# Patient Record
Sex: Female | Born: 1975 | Race: Black or African American | Hispanic: No | Marital: Single | Smoking: Former smoker
Health system: Southern US, Community
[De-identification: ages and names within clinical notes are randomized; demographics above are authoritative.]

## PROBLEM LIST (undated history)

## (undated) DIAGNOSIS — D649 Anemia, unspecified: Secondary | ICD-10-CM

## (undated) HISTORY — PX: WISDOM TOOTH EXTRACTION: SHX21

---

## 2014-07-30 ENCOUNTER — Emergency Department (HOSPITAL_COMMUNITY)
Admission: EM | Admit: 2014-07-30 | Discharge: 2014-07-30 | Disposition: A | Discharge status: Home / Self Care | Payer: Self-pay | Attending: Emergency Medicine | Admitting: Emergency Medicine

## 2014-07-30 ENCOUNTER — Emergency Department (HOSPITAL_COMMUNITY): Payer: Self-pay

## 2014-07-30 ENCOUNTER — Encounter (HOSPITAL_COMMUNITY): Payer: Self-pay | Admitting: Emergency Medicine

## 2014-07-30 DIAGNOSIS — R1011 Right upper quadrant pain: Secondary | ICD-10-CM | POA: Insufficient documentation

## 2014-07-30 DIAGNOSIS — Z862 Personal history of diseases of the blood and blood-forming organs and certain disorders involving the immune mechanism: Secondary | ICD-10-CM | POA: Insufficient documentation

## 2014-07-30 DIAGNOSIS — Z720 Tobacco use: Secondary | ICD-10-CM | POA: Insufficient documentation

## 2014-07-30 DIAGNOSIS — R079 Chest pain, unspecified: Secondary | ICD-10-CM | POA: Insufficient documentation

## 2014-07-30 DIAGNOSIS — R1013 Epigastric pain: Secondary | ICD-10-CM | POA: Insufficient documentation

## 2014-07-30 DIAGNOSIS — F419 Anxiety disorder, unspecified: Secondary | ICD-10-CM | POA: Insufficient documentation

## 2014-07-30 LAB — HEPATIC FUNCTION PANEL
ALK PHOS: 59 U/L (ref 39–117)
ALT: 8 U/L (ref 0–35)
AST: 19 U/L (ref 0–37)
Albumin: 3.9 g/dL (ref 3.5–5.2)
Bilirubin, Direct: <0.1 mg/dL (ref 0.0–0.5)
Total Bilirubin: 0.4 mg/dL (ref 0.3–1.2)
Total Protein: 7.8 g/dL (ref 6.0–8.3)

## 2014-07-30 LAB — BASIC METABOLIC PANEL
Anion gap: 7 (ref 5–15)
BUN: 12 mg/dL (ref 6–23)
CO2: 26 mmol/L (ref 19–32)
CREATININE: 0.76 mg/dL (ref 0.50–1.10)
Calcium: 8.8 mg/dL (ref 8.4–10.5)
Chloride: 107 mmol/L (ref 96–112)
GFR calc Af Amer: >90 mL/min (ref >90)
GFR calc non Af Amer: >90 mL/min (ref >90)
Glucose, Bld: 101 mg/dL — ABNORMAL HIGH (ref 70–99)
POTASSIUM: 3.4 mmol/L — AB (ref 3.5–5.1)
Sodium: 140 mmol/L (ref 135–145)

## 2014-07-30 LAB — CBC
HCT: 31 % — ABNORMAL LOW (ref 36.0–46.0)
Hemoglobin: 9.2 g/dL — ABNORMAL LOW (ref 12.0–15.0)
MCH: 18.2 pg — AB (ref 26.0–34.0)
MCHC: 29.7 g/dL — ABNORMAL LOW (ref 30.0–36.0)
MCV: 61.3 fL — ABNORMAL LOW (ref 78.0–100.0)
Platelets: 223 10*3/uL (ref 150–400)
RBC: 5.06 MIL/uL (ref 3.87–5.11)
RDW: 19.2 % — ABNORMAL HIGH (ref 11.5–15.5)
WBC: 8 10*3/uL (ref 4.0–10.5)

## 2014-07-30 LAB — I-STAT TROPONIN, ED: Troponin i, poc: 0 ng/mL (ref 0.00–0.08)

## 2014-07-30 LAB — BRAIN NATRIURETIC PEPTIDE: B NATRIURETIC PEPTIDE 5: 64.1 pg/mL (ref 0.0–100.0)

## 2014-07-30 LAB — LIPASE, BLOOD: LIPASE: 24 U/L (ref 11–59)

## 2014-07-30 MED ORDER — OXYCODONE-ACETAMINOPHEN 5-325 MG PO TABS: 1.0000 | ORAL_TABLET | Freq: Four times a day (QID) | ORAL | Status: DC | PRN

## 2014-07-30 MED ORDER — AZITHROMYCIN 250 MG PO TABS: 1000.0000 mg | ORAL_TABLET | Freq: Once | ORAL | Status: DC

## 2014-07-30 MED ORDER — AZITHROMYCIN 250 MG PO TABS: 250.0000 mg | ORAL_TABLET | Freq: Every day | ORAL | Status: DC

## 2014-07-30 MED ORDER — AZITHROMYCIN 250 MG PO TABS
500.0000 mg | ORAL_TABLET | Freq: Once | ORAL | Status: AC
  Administered 2014-07-30: 500 mg via ORAL
  Filled 2014-07-30: qty 2

## 2014-07-30 MED ORDER — CIPROFLOXACIN HCL 500 MG PO TABS: 500.0000 mg | ORAL_TABLET | Freq: Two times a day (BID) | ORAL | Status: DC

## 2014-07-30 MED ORDER — OXYCODONE-ACETAMINOPHEN 5-325 MG PO TABS
1.0000 | ORAL_TABLET | Freq: Once | ORAL | Status: AC
  Administered 2014-07-30: 1 via ORAL
  Filled 2014-07-30: qty 1

## 2014-07-30 MED ORDER — KETOROLAC TROMETHAMINE 60 MG/2ML IM SOLN
60.0000 mg | Freq: Once | INTRAMUSCULAR | Status: AC
  Administered 2014-07-30: 60 mg via INTRAMUSCULAR
  Filled 2014-07-30: qty 2

## 2014-07-30 MED ORDER — NAPROXEN 500 MG PO TABS: 500.0000 mg | ORAL_TABLET | Freq: Two times a day (BID) | ORAL | Status: DC

## 2014-07-30 NOTE — ED Notes (Addendum)
MD at bedside. EDPA PRESENT TO EVALUATE THIS PT

## 2014-07-30 NOTE — ED Provider Notes (Signed)
CSN: [id number]     Arrival date & time 07/30/14  1256 History   First MD Initiated Contact with Patient 07/30/14 1503     Chief Complaint  Patient presents with  . Chest Pain     (Consider location/radiation/quality/duration/timing/severity/associated sxs/prior Treatment) HPI Comments: Patient with history of iron-deficiency anemia -- presents with complaint of chest pressure which began at rest at approximately 7 PM last night. Patient initially felt the pressure in the middle of her chest and states that he later moved to the right side of her chest. It did not radiate. It was not associated with significant shortness of breath, palpitations, diaphoresis. Patient states that she felt nauseous. She ate a sub after the pain began and vomited afterwards. She has not had any further vomiting. Patient states that the pain is worse with breathing in and out, palpation of her chest, movement and certain positions. No cough or fever. Patient denies risk factors for pulmonary embolism including: unilateral leg swelling, history of DVT/PE/other blood clots, use of estrogens, recent immobilizations, recent surgery, recent travel (>4hr segment), malignancy, hemoptysis. Patient states that she had an episode of pain several weeks ago that was very similar to current. Patient denies drug and alcohol use including cocaine. Pain has gradually resolved over the past half an hour and she currently only has pain when sitting up or with someone pushing on her chest wall. Cardiac risk factors: smoking. No DM, HTN, high cholesterol.     Patient is a 39 y.o. female presenting with chest pain. The history is provided by the patient.  Chest Pain Associated symptoms: no abdominal pain, no back pain, no cough, no diaphoresis, no fever, no nausea, no palpitations, no shortness of breath and not vomiting     History reviewed. No pertinent past medical history. History reviewed. No pertinent past surgical history. No  family history on file. History  Substance Use Topics  . Smoking status: Current Every Day Smoker    Types: Cigarettes  . Smokeless tobacco: Not on file  . Alcohol Use: No   OB History    No data available     Review of Systems  Constitutional: Negative for fever and diaphoresis.  Eyes: Negative for redness.  Respiratory: Negative for cough and shortness of breath.   Cardiovascular: Positive for chest pain. Negative for palpitations and leg swelling.  Gastrointestinal: Negative for nausea, vomiting and abdominal pain.  Genitourinary: Negative for dysuria.  Musculoskeletal: Negative for back pain and neck pain.  Skin: Negative for rash.  Neurological: Negative for syncope and light-headedness.      Allergies  Review of patient's allergies indicates no known allergies.  Home Medications   Prior to Admission medications   Medication Sig Start Date End Date Taking? Authorizing Provider  aspirin EC 325 MG tablet Take 325 mg by mouth every 4 (four) hours as needed for mild pain.   Yes Historical Provider, MD  loratadine (CLARITIN) 10 MG tablet Take 10 mg by mouth daily as needed for allergies.   Yes Historical Provider, MD  pseudoephedrine (SUDAFED) 120 MG 12 hr tablet Take 120 mg by mouth 2 (two) times daily as needed for congestion.   Yes Historical Provider, MD   BP 103/70 mmHg  Pulse 84  Temp(Src) 98 F (36.7 C) (Oral)  Resp 20  SpO2 98%  LMP 07/28/2014 Physical Exam  Constitutional: She appears well-developed and well-nourished.  HENT:  Head: Normocephalic and atraumatic.  Right Ear: External ear normal.  Left Ear: External ear normal.  Nose: Nose normal.  Mouth/Throat: Oropharynx is clear and moist. No oropharyngeal exudate.  Eyes: Conjunctivae are normal. Pupils are equal, round, and reactive to light. Right eye exhibits no discharge. Left eye exhibits no discharge.  Neck: Normal range of motion. Neck supple.  Cardiovascular: Normal rate, regular rhythm and  normal heart sounds.   No murmur heard. Pulmonary/Chest: Effort normal and breath sounds normal. No respiratory distress. She has no wheezes. She has no rales. She exhibits tenderness.  Abdominal: Soft. Bowel sounds are normal. There is no hepatosplenomegaly. There is tenderness (moderate) in the right upper quadrant and epigastric area. There is no rebound, no guarding and no CVA tenderness.  Neurological: She is alert.  Skin: Skin is warm and dry.  Psychiatric: Her mood appears anxious.  Nursing note and vitals reviewed.   ED Course  Procedures (including critical care time) Labs Review Labs Reviewed  BASIC METABOLIC PANEL - Abnormal; Notable for the following:    Potassium 3.4 (*)    Glucose, Bld 101 (*)    All other components within normal limits  CBC - Abnormal; Notable for the following:    Hemoglobin 9.2 (*)    HCT 31.0 (*)    MCV 61.3 (*)    MCH 18.2 (*)    MCHC 29.7 (*)    RDW 19.2 (*)    All other components within normal limits  BRAIN NATRIURETIC PEPTIDE  HEPATIC FUNCTION PANEL  LIPASE, BLOOD  I-STAT TROPOININ, ED    Imaging Review Dg Chest 2 View  07/30/2014   CLINICAL DATA:  One day history of chest pain and difficulty breathing  EXAM: CHEST  2 VIEW  COMPARISON:  None.  FINDINGS: There is patchy bibasilar atelectatic change, more on the right than on the left. Lungs elsewhere clear. Heart size and pulmonary vascularity are normal. No adenopathy. No pneumothorax. No bone lesions.  IMPRESSION: Patchy bibasilar atelectasis. Suspect early pneumonia in these areas. Elsewhere lungs clear.   Electronically Signed   By: Lowella Grip M.D.   On: 07/30/2014 16:20     EKG Interpretation None       3:56 PM Patient seen and examined. Work-up initiated. Medications ordered. EKG reviewed. Repeat performed and is unchanged. Hepatic fcn panel and lipase ordered due to upper abd pain.    Date: 07/30/2014  Rate: 86  Rhythm: normal sinus rhythm  QRS Axis: normal   Intervals: normal  ST/T Wave abnormalities: nonspecific ST/T changes  Conduction Disutrbances:none  Narrative Interpretation:   Old EKG Reviewed: none available   Vital signs reviewed and are as follows: BP 103/70 mmHg  Pulse 84  Temp(Src) 98 F (36.7 C) (Oral)  Resp 20  SpO2 98%  LMP 07/28/2014  5:58 PM Checked patient, informed of results. She is now having more right-sided CP and appears uncomfortable. Vitals are stable.   PO Percocet and IM toradol pending.   7:53 PM Patient was much more comfortable after medications. Will discharge to home with pain medication, NSAIDs, azithromycin for possible pneumonia.  Patient was counseled to return with severe chest pain, especially if the pain is crushing or pressure-like and spreads to the arms, back, neck, or jaw, or if they have sweating, nausea, or shortness of breath with the pain. They were encouraged to call 911 with these symptoms.   They were also told to return if their chest pain gets worse and does not go away with rest, they have an attack of chest pain lasting longer than usual despite rest and treatment  with the medications their caregiver has prescribed, if they wake from sleep with chest pain or shortness of breath, if they feel dizzy or faint, if they have chest pain not typical of their usual pain, or if they have any other emergent concerns regarding their health.  The patient verbalized understanding and agreed.       MDM   Final diagnoses:  Chest pain, unspecified chest pain type   Patient with right-sided chest pain and upper abdominal pain on exam. Pain is been ongoing since last night. Patient has had negative troponin, nonischemic EKG with early repolarization changes, poor story for ACS. Symptoms are not exertional. Patient has relatively few risk factors for ACS. Patient is PERC negative. Given upper abdominal pain, hepatic function panel and lipase or ordered and were normal. Symptoms are currently  controlled. I do not suspect acute cholecystitis. No absolute indication for abdominal ultrasound at this time. Patient appears well at time of discharge. Her vital signs are stable. Appropriate return instructions discussed with patient at bedside.    Carlisle Cater, PA-C 07/30/14 1958  Leota Jacobsen, MD 08/03/14 [phone]

## 2014-07-30 NOTE — Discharge Instructions (Signed)
Please read and follow all provided instructions.  Your diagnoses today include:  1. Chest pain, unspecified chest pain type    Tests performed today include:  An EKG of your heart  A chest x-ray - shows possible pneumonia  Cardiac enzymes - a blood test for heart muscle damage  Blood counts and electrolytes  Liver and pancreas test - normal  Vital signs. See below for your results today.   Medications prescribed:   Azithromycin - antibiotic for respiratory infection  You have been prescribed an antibiotic medicine: take the entire course of medicine even if you are feeling better. Stopping early can cause the antibiotic not to work.   Percocet (oxycodone/acetaminophen) - narcotic pain medication  DO NOT drive or perform any activities that require you to be awake and alert because this medicine can make you drowsy. BE VERY CAREFUL not to take multiple medicines containing Tylenol (also called acetaminophen). Doing so can lead to an overdose which can damage your liver and cause liver failure and possibly death.   Naproxen - anti-inflammatory pain medication  Do not exceed 500mg  naproxen every 12 hours, take with food  You have been prescribed an anti-inflammatory medication or NSAID. Take with food. Take smallest effective dose for the shortest duration needed for your pain. Stop taking if you experience stomach pain or vomiting.   Take any prescribed medications only as directed.  Follow-up instructions: Please follow-up with your primary care provider as soon as you can for further evaluation of your symptoms.   Return instructions:  SEEK IMMEDIATE MEDICAL ATTENTION IF:  You have severe chest pain, especially if the pain is crushing or pressure-like and spreads to the arms, back, neck, or jaw, or if you have sweating, nausea (feeling sick to your stomach), or shortness of breath. THIS IS AN EMERGENCY. Don't wait to see if the pain will go away. Get medical help at  once. Call 911 or 0 (operator). DO NOT drive yourself to the hospital.   Your chest pain gets worse and does not go away with rest.   You have an attack of chest pain lasting longer than usual, despite rest and treatment with the medications your caregiver has prescribed.   You wake from sleep with chest pain or shortness of breath.  You feel dizzy or faint.  You have chest pain not typical of your usual pain for which you originally saw your caregiver.   You have any other emergent concerns regarding your health.  Additional Information: Chest pain comes from many different causes. Your caregiver has diagnosed you as having chest pain that is not specific for one problem, but does not require admission.  You are at low risk for an acute heart condition or other serious illness.   Your vital signs today were: BP 133/100 mmHg   Pulse 84   Temp(Src) 98 F (36.7 C) (Oral)   Resp 16   Ht 5\' 5"  (1.651 m)   Wt 115 lb (52.164 kg)   BMI 19.14 kg/m2   SpO2 93%   LMP 07/28/2014 If your blood pressure (BP) was elevated above 135/85 this visit, please have this repeated by your doctor within one month. --------------

## 2014-07-30 NOTE — ED Notes (Signed)
MD at bedside. EDPA JOSH

## 2014-07-30 NOTE — ED Notes (Signed)
MD at bedside. EDPA PRESENT RE EVALUATING THIS PT

## 2014-07-30 NOTE — ED Notes (Signed)
MD at bedside. [place]

## 2014-07-30 NOTE — ED Notes (Signed)
Pt c/o mid chest pain that started last night that "feels like an elephant on her chest". Pt states shob and vomiting last night.  Pt restless in wheelchair.

## 2014-07-30 NOTE — ED Notes (Signed)
Patient transported to X-ray 

## 2016-03-15 ENCOUNTER — Encounter (HOSPITAL_COMMUNITY): Payer: Self-pay | Admitting: Emergency Medicine

## 2016-03-15 ENCOUNTER — Ambulatory Visit (HOSPITAL_COMMUNITY)
Admission: EM | Admit: 2016-03-15 | Discharge: 2016-03-15 | Disposition: A | Discharge status: Home / Self Care | Payer: Self-pay | Attending: Family Medicine | Admitting: Family Medicine

## 2016-03-15 DIAGNOSIS — M545 Low back pain: Secondary | ICD-10-CM

## 2016-03-15 DIAGNOSIS — M542 Cervicalgia: Secondary | ICD-10-CM

## 2016-03-15 MED ORDER — CYCLOBENZAPRINE HCL 5 MG PO TABS: 5.0000 mg | ORAL_TABLET | Freq: Three times a day (TID) | ORAL | 0 refills | Status: DC

## 2016-03-15 MED ORDER — IBUPROFEN 800 MG PO TABS: 800.0000 mg | ORAL_TABLET | Freq: Three times a day (TID) | ORAL | 1 refills | Status: DC
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
## 2019-07-16 ENCOUNTER — Ambulatory Visit
Admission: EM | Admit: 2019-07-16 | Discharge: 2019-07-16 | Disposition: A | Discharge status: Home / Self Care | Payer: BLUE CROSS/BLUE SHIELD | Attending: Emergency Medicine | Admitting: Emergency Medicine

## 2019-07-16 DIAGNOSIS — L84 Corns and callosities: Secondary | ICD-10-CM

## 2019-07-16 DIAGNOSIS — L97521 Non-pressure chronic ulcer of other part of left foot limited to breakdown of skin: Secondary | ICD-10-CM | POA: Diagnosis not present

## 2019-07-16 DIAGNOSIS — N852 Hypertrophy of uterus: Secondary | ICD-10-CM | POA: Diagnosis not present

## 2019-07-16 DIAGNOSIS — B351 Tinea unguium: Secondary | ICD-10-CM

## 2019-07-16 MED ORDER — DOXYCYCLINE HYCLATE 100 MG PO CAPS: 100.0000 mg | ORAL_CAPSULE | Freq: Two times a day (BID) | ORAL | 0 refills | Status: AC

## 2019-07-16 NOTE — ED Triage Notes (Signed)
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 Pt c/o "hard places in my stomach for a few months".

## 2019-07-16 NOTE — ED Provider Notes (Signed)
[facility]    CSN: [id number] Arrival date & time: 07/16/19  1738      History   Chief Complaint Chief Complaint  Patient presents with  . Foot Pain    HPI Mackenzie Bray is a 44 y.o. female without significant medical history presenting for numerous concerns.  Patient noting chronic left toe pain for the last 2 months.  States she has been using Lotrimin OTC for her great toe which she feels has a fungal infection.  Patient also has callus medially for which she has tried changing shoes.  Patient recently noted small opening in between her fourth and fifth toe on affected foot without surrounding erythema.  Patient denies history of diabetes, immunocompromise state.  Denies history of foot ulcers or decreased sensation.  No trauma to the foot.  Has not tried anything for this. Patient also wanting her stomach looked at due to "bumps".  Patient states it feels like something is growing in there.  Patient is a homosexual female: Does not have sex with men-low concern for pregnancy.  LMP 06/25/2019.  Patient endorses chronic history since high school of long, crampy periods for which she is never seek evaluation.  States today's visit was prompted by recent breast cancer diagnosis of her mother: Stage IV.  Patient denies abdominal pain, pelvic pain, vaginal discharge, fever.  Does not currently have primary care.   History reviewed. No pertinent past medical history.  There are no problems to display for this patient.   History reviewed. No pertinent surgical history.  OB History   No obstetric history on file.      Home Medications    Prior to Admission medications   Medication Sig Start Date End Date Taking? Authorizing Provider  doxycycline (VIBRAMYCIN) 100 MG capsule Take 1 capsule (100 mg total) by mouth 2 (two) times daily for 7 days. 07/16/19 07/23/19  Hall-Potvin, [place], PA-C    Family History History reviewed. No pertinent family history.  Social  History Social History   Tobacco Use  . Smoking status: Current Every Day Smoker    Types: Cigarettes  . Smokeless tobacco: Never Used  Substance Use Topics  . Alcohol use: No  . Drug use: Not on file     Allergies   Patient has no known allergies.   Review of Systems As per HPI   Physical Exam Triage Vital Signs ED Triage Vitals  Enc Vitals Group     BP      Pulse      Resp      Temp      Temp src      SpO2      Weight      Height      Head Circumference      Peak Flow      Pain Score      Pain Loc      Pain Edu?      Excl. in [place]?    No data found.  Updated Vital Signs BP 136/85 (BP Location: Right Arm)   Pulse 66   Temp 98.2 F (36.8 C) (Oral)   Resp 18   LMP 06/25/2019   SpO2 99%   Visual Acuity Right Eye Distance:   Left Eye Distance:   Bilateral Distance:    Right Eye Near:   Left Eye Near:    Bilateral Near:     Physical Exam Constitutional:      General: She is not in acute distress.  Appearance: She is not ill-appearing.     Comments: Thin  HENT:     Head: Normocephalic and atraumatic.     Mouth/Throat:     Mouth: Mucous membranes are moist.     Pharynx: Oropharynx is clear.  Eyes:     General: No scleral icterus.    Pupils: Pupils are equal, round, and reactive to light.  Cardiovascular:     Rate and Rhythm: Normal rate and regular rhythm.     Pulses:          Dorsalis pedis pulses are 2+ on the right side and 2+ on the left side.  Pulmonary:     Effort: Pulmonary effort is normal. No respiratory distress.     Breath sounds: No wheezing or rales.  Abdominal:     General: Bowel sounds are normal.     Tenderness: There is no abdominal tenderness.     Comments: Smooth, firm, enlarged uterus up to umbilicus with slight asymmetry (R>L).  No nodules/adventitious masses appreciated.  Musculoskeletal:        General: Normal range of motion.     Cervical back: No tenderness.     Right lower leg: No edema.     Left lower leg: No  edema.     Right foot: Normal range of motion. No deformity.     Left foot: Normal range of motion. No deformity.       Feet:  Feet:     Comments: Feet are neurovascularly intact bilaterally. Lymphadenopathy:     Cervical: No cervical adenopathy.  Skin:    Capillary Refill: Capillary refill takes less than 2 seconds.     Coloration: Skin is not jaundiced or pale.     Findings: No rash.  Neurological:     General: No focal deficit present.     Mental Status: She is alert and oriented to person, place, and time.      UC Treatments / Results  Labs (all labs ordered are listed, but only abnormal results are displayed) Labs Reviewed - No data to display  EKG   Radiology No results found.  Procedures Procedures (including critical care time)  Medications Ordered in UC Medications - No data to display  Initial Impression / Assessment and Plan / UC Course  I have reviewed the triage vital signs and the nursing notes.  Pertinent labs & imaging results that were available during my care of the patient were reviewed by me and considered in my medical decision making (see chart for details).     I have reviewed the triage vital signs and the nursing notes.  All pertinent labs & imaging results that were available during my care of the patient were reviewed by me and considered in my medical decision making (see chart for details).  44 year old female without chronic care presenting for numerous concerns.  H&P consistent with onychomycosis, callus, superficial foot ulcer.  Patient afebrile, nontoxic.  Will start doxycycline, have patient follow-up with podiatry in 1 week for further evaluation/management.  Patient to continue Lotrimin, loose-fitting footwear.  Patient also has significantly enlarged uterus: H&P concerning for undiagnosed/untreated fibroids.  Patient feels that her weight has been stable: Reports good appetite and denies sleep disruption, night sweats.  Patient does  appear to be slightly nervous given mother's recent malignant diagnosis.  This provider relayed contact information for OB/GYN with instructions to follow-up in 1 week as she will need further evaluation for this.  Return precautions discussed, patient verbalized understanding and is  agreeable to plan. Final Clinical Impressions(s) / UC Diagnoses   Final diagnoses:  Skin ulcer of toe of left foot, limited to breakdown of skin ([place])  Enlarged uterus  Callus of foot  Onychomycosis     Discharge Instructions     Take antibiotic as prescribed.   Very important follow-up with your doctor (podiatry) for further evaluation/management. Important follow-up with OB/GYN for further evaluation and management about your abdomen. Please go to ER if you have persistent/severe vaginal bleeding, pain, abdominal pain, fever, lightheadedness, dizziness, palpitations, shortness of breath.    ED Prescriptions    Medication Sig Dispense Auth. Provider   doxycycline (VIBRAMYCIN) 100 MG capsule Take 1 capsule (100 mg total) by mouth 2 (two) times daily for 7 days. 14 capsule Hall-Potvin, [place], PA-C     PDMP not reviewed this encounter.   Hall-Potvin, [place], [place] 07/17/19 1103

## 2019-07-16 NOTE — Discharge Instructions (Signed)
Take antibiotic as prescribed.   Very important follow-up with your doctor (podiatry) for further evaluation/management. Important follow-up with OB/GYN for further evaluation and management about your abdomen. Please go to ER if you have persistent/severe vaginal bleeding, pain, abdominal pain, fever, lightheadedness, dizziness, palpitations, shortness of breath.

## 2019-07-17 ENCOUNTER — Encounter: Payer: Self-pay | Admitting: Emergency Medicine

## 2020-12-07 ENCOUNTER — Telehealth: Payer: Self-pay | Admitting: *Deleted

## 2020-12-07 NOTE — Telephone Encounter (Signed)
Called and left the patient a message to call the office back to schedule a new patient appt

## 2020-12-08 ENCOUNTER — Encounter (HOSPITAL_COMMUNITY): Payer: Self-pay

## 2020-12-08 ENCOUNTER — Emergency Department (HOSPITAL_COMMUNITY)
Admission: EM | Admit: 2020-12-08 | Discharge: 2020-12-08 | Discharge status: Against Medical Advice | Payer: BC Managed Care – PPO | Attending: Emergency Medicine | Admitting: Emergency Medicine

## 2020-12-08 DIAGNOSIS — R1909 Other intra-abdominal and pelvic swelling, mass and lump: Secondary | ICD-10-CM | POA: Insufficient documentation

## 2020-12-08 DIAGNOSIS — F17210 Nicotine dependence, cigarettes, uncomplicated: Secondary | ICD-10-CM | POA: Insufficient documentation

## 2020-12-08 DIAGNOSIS — N920 Excessive and frequent menstruation with regular cycle: Secondary | ICD-10-CM | POA: Insufficient documentation

## 2020-12-08 DIAGNOSIS — D649 Anemia, unspecified: Secondary | ICD-10-CM | POA: Insufficient documentation

## 2020-12-08 LAB — COMPREHENSIVE METABOLIC PANEL
ALT: 9 U/L (ref 0–44)
AST: 13 U/L — ABNORMAL LOW (ref 15–41)
Albumin: 3.8 g/dL (ref 3.5–5.0)
Alkaline Phosphatase: 45 U/L (ref 38–126)
Anion gap: 5 (ref 5–15)
BUN: 18 mg/dL (ref 6–20)
CO2: 24 mmol/L (ref 22–32)
Calcium: 8.8 mg/dL — ABNORMAL LOW (ref 8.9–10.3)
Chloride: 108 mmol/L (ref 98–111)
Creatinine, Ser: 0.63 mg/dL (ref 0.44–1.00)
GFR, Estimated: >60 mL/min (ref >60)
Glucose, Bld: 112 mg/dL — ABNORMAL HIGH (ref 70–99)
Potassium: 3.9 mmol/L (ref 3.5–5.1)
Sodium: 137 mmol/L (ref 135–145)
Total Bilirubin: 0.3 mg/dL (ref 0.3–1.2)
Total Protein: 7.8 g/dL (ref 6.5–8.1)

## 2020-12-08 LAB — CBC WITH DIFFERENTIAL/PLATELET
Abs Immature Granulocytes: 0.01 10*3/uL (ref 0.00–0.07)
Basophils Absolute: 0 10*3/uL (ref 0.0–0.1)
Basophils Relative: 1 %
Eosinophils Absolute: 0.2 10*3/uL (ref 0.0–0.5)
Eosinophils Relative: 5 %
HCT: 19.8 % — ABNORMAL LOW (ref 36.0–46.0)
Hemoglobin: 4.6 g/dL — CL (ref 12.0–15.0)
Immature Granulocytes: 0 %
Lymphocytes Relative: 24 %
Lymphs Abs: 1.1 10*3/uL (ref 0.7–4.0)
MCH: 12.1 pg — ABNORMAL LOW (ref 26.0–34.0)
MCHC: 23.2 g/dL — ABNORMAL LOW (ref 30.0–36.0)
MCV: 52.2 fL — ABNORMAL LOW (ref 80.0–100.0)
Monocytes Absolute: 0.4 10*3/uL (ref 0.1–1.0)
Monocytes Relative: 7 %
Neutro Abs: 3.1 10*3/uL (ref 1.7–7.7)
Neutrophils Relative %: 63 %
Platelets: 197 10*3/uL (ref 150–400)
RBC: 3.79 MIL/uL — ABNORMAL LOW (ref 3.87–5.11)
RDW: 24.3 % — ABNORMAL HIGH (ref 11.5–15.5)
WBC: 4.8 10*3/uL (ref 4.0–10.5)
nRBC: 0 % (ref 0.0–0.2)

## 2020-12-08 LAB — I-STAT BETA HCG BLOOD, ED (MC, WL, AP ONLY): I-stat hCG, quantitative: <5 m[IU]/mL (ref <5)

## 2020-12-08 LAB — ABO/RH: ABO/RH(D): O POS

## 2020-12-08 LAB — PROTIME-INR
INR: 1 (ref 0.8–1.2)
Prothrombin Time: 13.6 seconds (ref 11.4–15.2)

## 2020-12-08 LAB — PREPARE RBC (CROSSMATCH)

## 2020-12-08 MED ORDER — SODIUM CHLORIDE 0.9 % IV SOLN
10.0000 mL/h | Freq: Once | INTRAVENOUS | Status: AC
  Administered 2020-12-08: 10 mL/h via INTRAVENOUS

## 2020-12-08 MED ORDER — FERROUS SULFATE 325 (65 FE) MG PO TABS: 325.0000 mg | ORAL_TABLET | Freq: Every day | ORAL | 0 refills | Status: DC

## 2020-12-08 NOTE — Discharge Instructions (Signed)
Your hemoglobin today was 4.6  You are transfused blood  We recommended hospital observation but you declined and requested to be discharged  Please call your gynecologist and make a repeat appointment for the next 24 to 48 hours to recheck your hemoglobin levels  Return to the ED immediately if you have chest pain, shortness of breath, lightheadedness, passing out with exertion, heavy vaginal bleeding or if you are unable to follow-up with your gynecologist to recheck your blood work

## 2020-12-08 NOTE — ED Notes (Addendum)
Per OBGYN-new patient of Dr Loman [place] low Hgb 5.8-needs transfusion and iron supplement-any question call [phone] Olivia Mackie, RN

## 2020-12-08 NOTE — ED Provider Notes (Cosign Needed)
[name]   CSN: [id number] Arrival date & time: 12/08/20  1134     History Chief Complaint  Patient presents with   Abnormal Lab    Mackenzie Bray is a 45 y.o. female presents to the ED for evaluation of low hemoglobin.  She was contacted by her gynecologist this morning and told that her hemoglobin was around 5.  States she went to the gynecologist yesterday for evaluation of a pelvic mass.  She has also had very heavy menstrual cycles all her life.  Her LMP lasted 7 days and ended 3 days ago.  She had an ultrasound that showed a mass in her pelvis as well as fibroids.  She had an endometrial biopsy done.  The gynecologist told her that she would be referred to oncology.  There is some concern for a cancerous process.  Her mother has had history of breast cancer.  Reports she has been feeling more fatigued than usual despite eating well and getting enough sleep.  No lightheadedness, chest pain, shortness of breath.  Has been told in the past that she is anemic and has occasionally taken iron pills.  She is no longer taking iron pills.  Has never required a transfusion.  She is not on blood thinners.   HPI     History reviewed. No pertinent past medical history.  There are no problems to display for this patient.   History reviewed. No pertinent surgical history.   OB History   No obstetric history on file.     History reviewed. No pertinent family history.  Social History   Tobacco Use   Smoking status: Every Day    Pack years: 0.00    Types: Cigarettes   Smokeless tobacco: Never  Substance Use Topics   Alcohol use: No    Home Medications Prior to Admission medications   Not on File    Allergies    Patient has no known allergies.  Review of Systems   Review of Systems  Constitutional:  Positive for fatigue.  All other systems reviewed and are negative.  Physical Exam Updated Vital Signs BP 103/67    Pulse 77   Temp 97.9 F (36.6 C) (Oral)   Resp (!) 22   Ht 5' 5.5" (1.664 m)   Wt 49.4 kg   LMP 11/30/2020   SpO2 100%   BMI 17.86 kg/m   Physical Exam Vitals and nursing note reviewed.  Constitutional:      Appearance: She is well-developed.     Comments: Non toxic in NAD  HENT:     Head: Normocephalic and atraumatic.     Nose: Nose normal.  Eyes:     Conjunctiva/sclera: Conjunctivae normal.  Cardiovascular:     Rate and Rhythm: Normal rate and regular rhythm.  Pulmonary:     Effort: Pulmonary effort is normal.     Breath sounds: Normal breath sounds.  Abdominal:     General: Bowel sounds are normal.     Palpations: Abdomen is soft.     Tenderness: There is no abdominal tenderness.     Comments: Palpable large firm mass in right lower abdomen.  Non tender.    Musculoskeletal:        General: Normal range of motion.     Cervical back: Normal range of motion.  Skin:    General: Skin is warm and dry.     Capillary Refill: Capillary refill takes less than 2 seconds.  Neurological:  Mental Status: She is alert.  Psychiatric:        Behavior: Behavior normal.    ED Results / Procedures / Treatments   Labs (all labs ordered are listed, but only abnormal results are displayed) Labs Reviewed  COMPREHENSIVE METABOLIC PANEL - Abnormal; Notable for the following components:      Result Value   Glucose, Bld 112 (*)    Calcium 8.8 (*)    AST 13 (*)    All other components within normal limits  CBC WITH DIFFERENTIAL/PLATELET - Abnormal; Notable for the following components:   RBC 3.79 (*)    Hemoglobin 4.6 (*)    HCT 19.8 (*)    MCV 52.2 (*)    MCH 12.1 (*)    MCHC 23.2 (*)    RDW 24.3 (*)    All other components within normal limits  PROTIME-INR  I-STAT BETA HCG BLOOD, ED ([facility], [facility], [facility] ONLY)  TYPE AND SCREEN  PREPARE RBC (CROSSMATCH)  ABO/RH    EKG None  Radiology No results found.  Procedures Procedures   Medications Ordered in ED Medications  0.9  %  sodium chloride infusion (10 mL/hr Intravenous New Bag/Given 12/08/20 1423)    ED Course  I have reviewed the triage vital signs and the nursing notes.  Pertinent labs & imaging results that were available during my care of the patient were reviewed by me and considered in my medical decision making (see chart for details).    MDM Rules/Calculators/A&P                          45 year old female with history of heavy menstrual cycles presents to the ED for evaluation of abnormal hemoglobin.  Blood work was done at gynecology office yesterday and hemoglobin was around 5.  She is getting evaluated for a known pelvic mass that is concerning for cancer.  Reports mild, gradual fatigue.  No chest pain, shortness of breath, lightheadedness, passing out, oral anticoagulant use.    EMR, triage nursing notes reviewed.  I am unable to look at gynecology notes.  She has no significant past medical history on record.  Lab work ordered in triage, these were personally reviewed and interpreted by me.  Lab work reveals-hemoglobin 4.6, HCT 19.8.  Normal WBC.  Labs otherwise unremarkable.  LFTs normal.  PT/INR unremarkable.  Normal platelets.  Imaging reveals-N/A  Medicines ordered in the ED-2 units PRBC to be transfused in the ED  ED course and MDM 1510: patient re-evaluated and no clinical decline. HD stable. No active bleeding.    Presentation is most consistent with symptomatic anemia due to menorrhagia.  Hemoglobin today is critically low.  Patient will need blood transfusion.  Recommended hospital observation to recheck H&H, monitor however patient declined.  Attempted to convince her twice but she states that she could not get a ride to return home.  She understands the risk of leaving [place].  She promises she will check in with her gynecologist in the next 24-48 hours to recheck her hemoglobin.  She will return to the ED for any new or worsening symptoms.  Unless patient changes  her mind, she will be discharged by oncoming ED PA after her blood transfusion.   Final Clinical Impression(s) / ED Diagnoses Final diagnoses:  Symptomatic anemia  Menorrhagia with regular cycle    Rx / DC Orders ED Discharge Orders     None  Kinnie Feil, PA-C 12/08/20 1515

## 2020-12-08 NOTE — ED Triage Notes (Signed)
Pt arrived via walk in, c/o abnormal labs. States was seen at GYN for mass u/s and biopsy yesterday, lab work done due to heavy vaginal bleeding, hemoglobin 5.8. Denies any bleeding currently.

## 2020-12-08 NOTE — ED Provider Notes (Signed)
Patient is a 45 year old female whose care was transferred to me at shift change from [facility].  Her HPI is below:  Mackenzie Bray is a 45 y.o. female presents to the ED for evaluation of low hemoglobin.  She was contacted by her gynecologist this morning and told that her hemoglobin was around 5.  States she went to the gynecologist yesterday for evaluation of a pelvic mass.  She has also had very heavy menstrual cycles all her life.  Her LMP lasted 7 days and ended 3 days ago.  She had an ultrasound that showed a mass in her pelvis as well as fibroids.  She had an endometrial biopsy done.  The gynecologist told her that she would be referred to oncology.  There is some concern for a cancerous process.  Her mother has had history of breast cancer.  Reports she has been feeling more fatigued than usual despite eating well and getting enough sleep.  No lightheadedness, chest pain, shortness of breath.  Has been told in the past that she is anemic and has occasionally taken iron pills.  She is no longer taking iron pills.  Has never required a transfusion.  She is not on blood thinners.   HPI Physical Exam  BP 103/67   Pulse 77   Temp 97.9 F (36.6 C) (Oral)   Resp (!) 22   Ht 5' 5.5" (1.664 m)   Wt 49.4 kg   LMP 11/30/2020   SpO2 100%   BMI 17.86 kg/m   Physical Exam Vitals and nursing note reviewed. Constitutional:      Appearance: She is well-developed.    Comments: Non toxic in NAD  HENT:    Head: Normocephalic and atraumatic.    Nose: Nose normal. Eyes:    Conjunctiva/sclera: Conjunctivae normal. Cardiovascular:    Rate and Rhythm: Normal rate and regular rhythm. Pulmonary:    Effort: Pulmonary effort is normal.    Breath sounds: Normal breath sounds. Abdominal:    General: Bowel sounds are normal.    Palpations: Abdomen is soft.    Tenderness: There is no abdominal tenderness.    Comments: Palpable large firm mass in right lower abdomen.  Non tender.     Musculoskeletal:        General: Normal range of motion.    Cervical back: Normal range of motion. Skin:    General: Skin is warm and dry.    Capillary Refill: Capillary refill takes less than 2 seconds. Neurological:    Mental Status: She is alert. Psychiatric:        Behavior: Behavior normal. ED Course/Procedures     Procedures  MDM  Patient is a 45 year old female whose care was transferred to me at shift change from previous provider.  Please see her HPI below for further information.  In summary, patient has a history of menorrhagia and came to the emergency department today due to an abnormally low hemoglobin.  Hemoglobin today was found to be 4.6.  Patient endorses mild gradual fatigue but otherwise denies any complaints such as chest pain, shortness of breath, lightheadedness, syncope, anticoagulant use.  Patient transfused with 2 units of packed red blood cells and offered admission by the previous provider but she declined.  Request transfusion and discharged home.  After patient transfused she states she is feeling much better.  Denies any symptoms at this time.  Discussed admission once again but she once again declined.  She is going to follow-up with her OB/GYN.  Given  her symptomatic microcytic anemia feel that restarting patient on ferrous sulfate is reasonable.  She states she used to take this in the past but discontinued this medication but is amenable to restarting it.  Confirmed patient would like to leave [place] once again.  Urged her to return to the emergency department if she develops any new or worsening symptoms.     Rayna Sexton, PA-C 12/08/20 2050    Lajean Saver, MD 12/09/20 [phone]

## 2020-12-09 LAB — TYPE AND SCREEN
ABO/RH(D): O POS
Antibody Screen: NEGATIVE
Unit division: 0
Unit division: 0

## 2020-12-09 LAB — BPAM RBC
Blood Product Expiration Date: 202207162359
Blood Product Expiration Date: 202207162359
ISSUE DATE / TIME: 202206151402
ISSUE DATE / TIME: 202206151727
Unit Type and Rh: 5100
Unit Type and Rh: 5100

## 2020-12-09 NOTE — Telephone Encounter (Signed)
Returned the patient's call and scheduled a new patient appt for 6/20 at 9 am. Patient given the address and phone number for the clinic. Patient given an arrival time of 8:30 am. Patient given the policy for mask and visitors

## 2020-12-10 ENCOUNTER — Encounter: Payer: Self-pay | Admitting: Gynecologic Oncology

## 2020-12-13 ENCOUNTER — Other Ambulatory Visit: Payer: Self-pay | Admitting: Gynecologic Oncology

## 2020-12-13 ENCOUNTER — Inpatient Hospital Stay: Payer: BC Managed Care – PPO | Attending: Gynecologic Oncology | Admitting: Gynecologic Oncology

## 2020-12-13 ENCOUNTER — Other Ambulatory Visit: Payer: Self-pay

## 2020-12-13 ENCOUNTER — Inpatient Hospital Stay: Payer: BC Managed Care – PPO

## 2020-12-13 VITALS — BP 110/74 | HR 63 | Temp 97.9°F | Resp 18 | Ht 65.0 in | Wt 112.4 lb

## 2020-12-13 DIAGNOSIS — D500 Iron deficiency anemia secondary to blood loss (chronic): Secondary | ICD-10-CM | POA: Insufficient documentation

## 2020-12-13 DIAGNOSIS — F17210 Nicotine dependence, cigarettes, uncomplicated: Secondary | ICD-10-CM | POA: Insufficient documentation

## 2020-12-13 DIAGNOSIS — R1900 Intra-abdominal and pelvic swelling, mass and lump, unspecified site: Secondary | ICD-10-CM | POA: Insufficient documentation

## 2020-12-13 DIAGNOSIS — D259 Leiomyoma of uterus, unspecified: Secondary | ICD-10-CM | POA: Insufficient documentation

## 2020-12-13 DIAGNOSIS — Z803 Family history of malignant neoplasm of breast: Secondary | ICD-10-CM | POA: Diagnosis not present

## 2020-12-13 DIAGNOSIS — N83291 Other ovarian cyst, right side: Secondary | ICD-10-CM

## 2020-12-13 DIAGNOSIS — D251 Intramural leiomyoma of uterus: Secondary | ICD-10-CM | POA: Insufficient documentation

## 2020-12-13 DIAGNOSIS — D250 Submucous leiomyoma of uterus: Secondary | ICD-10-CM

## 2020-12-13 LAB — CMP (CANCER CENTER ONLY)
ALT: 9 U/L (ref 0–44)
AST: 13 U/L — ABNORMAL LOW (ref 15–41)
Albumin: 3.7 g/dL (ref 3.5–5.0)
Alkaline Phosphatase: 46 U/L (ref 38–126)
Anion gap: 7 (ref 5–15)
BUN: 15 mg/dL (ref 6–20)
CO2: 24 mmol/L (ref 22–32)
Calcium: 8.7 mg/dL — ABNORMAL LOW (ref 8.9–10.3)
Chloride: 107 mmol/L (ref 98–111)
Creatinine: 0.8 mg/dL (ref 0.44–1.00)
GFR, Estimated: >60 mL/min (ref >60)
Glucose, Bld: 89 mg/dL (ref 70–99)
Potassium: 4 mmol/L (ref 3.5–5.1)
Sodium: 138 mmol/L (ref 135–145)
Total Bilirubin: 0.4 mg/dL (ref 0.3–1.2)
Total Protein: 7.7 g/dL (ref 6.5–8.1)

## 2020-12-13 LAB — CBC WITH DIFFERENTIAL (CANCER CENTER ONLY)
Abs Immature Granulocytes: 0.02 10*3/uL (ref 0.00–0.07)
Basophils Absolute: 0 10*3/uL (ref 0.0–0.1)
Basophils Relative: 1 %
Eosinophils Absolute: 0.2 10*3/uL (ref 0.0–0.5)
Eosinophils Relative: 3 %
HCT: 25.8 % — ABNORMAL LOW (ref 36.0–46.0)
Hemoglobin: 7 g/dL — ABNORMAL LOW (ref 12.0–15.0)
Immature Granulocytes: 0 %
Lymphocytes Relative: 17 %
Lymphs Abs: 1.2 10*3/uL (ref 0.7–4.0)
MCH: 16.2 pg — ABNORMAL LOW (ref 26.0–34.0)
MCHC: 27.1 g/dL — ABNORMAL LOW (ref 30.0–36.0)
MCV: 59.7 fL — ABNORMAL LOW (ref 80.0–100.0)
Monocytes Absolute: 0.6 10*3/uL (ref 0.1–1.0)
Monocytes Relative: 9 %
Neutro Abs: 4.9 10*3/uL (ref 1.7–7.7)
Neutrophils Relative %: 70 %
Platelet Count: 164 10*3/uL (ref 150–400)
RBC: 4.32 MIL/uL (ref 3.87–5.11)
RDW: 36.6 % — ABNORMAL HIGH (ref 11.5–15.5)
WBC Count: 7 10*3/uL (ref 4.0–10.5)
nRBC: 0 % (ref 0.0–0.2)

## 2020-12-13 NOTE — Progress Notes (Signed)
Consult Note: Gyn-Onc  Consult was requested by Dr. Pamala Hurry for the evaluation of Mackenzie Bray 45 y.o. female  CC:  Chief Complaint  Patient presents with   Complex cyst of right ovary   fibroid uterus    Assessment/Plan:  Mackenzie Bray  is a 45 y.o.  year old with a bulky fibroid uterus (16cm) with a possible right adnexal mass and possible degenerating submucosal fibroid in the setting of a bulky fibroid uterus. She has severe acute blood loss anemia requiring transfusion.  I am recommending definitive management with hysterectomy. I believe she is a good candidate for minimally invasive approach (robotic assisted TLH >250gm, bilateral salpingectomy, unilateral oophorectomy, minilaparotomy for specimen retrieval). Staging procedures would be necessary if a malignancy in the ovary was identified. I counseled her that she is at higher risk for complications due to her pre-existing anemia and the large/bulky uterine size. Risks include  bleeding, infection, damage to internal organs (such as bladder,ureters, bowels), blood clot, reoperation and rehospitalization.  A few things need to be ascertained or take place before proceding with surgery:  1/ we will obtain her endometrial biopsy result from Dr. Jerrilyn Cairo office to ensure that this is benign. 2/ We will start her on iron replacement therapy 3/ We will order an MRI of the pelvis to better characterize the right adnexal mass and determine if this is a fibroid vs ovarian lesion, and to determine the size and location of the fibroids for surgical planning and risk assessment of LMS 4/ We will recheck labs today to ascertain nutritional status and Hb/Hcrit post transfusion; and CA 125 (understanding that elevations may be present in the setting of benign fibroids or endometriosis). 5/ We will Rx Lupron for 3 months to shrink the fibroids (making MIS approach more feasible and to prevent ongoing losses from menorrhagia preop  to optimize hemoglobin) 6/ She is scheduled for surgery at the end of August (unless prior imaging and work-up dictates that a surgical date is indicated more expeditiously). 7/ She will return for preop with Joylene John prior to surgery.   She will require 4 weeks off of work postop.    HPI: Mackenzie Bray is a 45 year old P0 who was seen in consultation at the request of Dr Pamala Hurry for evaluation of  a complex right ovarian mass and symptomatic fibroid uterus.  The patient has had menorrhagia and dysmenorrhea symptoms since menarche.  For several preceding years prior to her presentation she was developing right-sided pelvic pain and growth in the mass in her lower abdomen.  She did not seek care for this as she lacked health insurance.  There was no acute change to either her menorrhagia or mask symptoms however when she gained health insurance in 2022 she was able to seek care with a gynecologist.  On Nov 19, 2020 she saw Dr. Pamala Hurry for new patient visit for this pelvic mass and menorrhagia.  Pelvic exam confirmed the uterus palpated to just above the umbilicus and mild tenderness.  She was scheduled for a transvaginal ultrasound scan which was performed on 12/07/2020 in the office and revealed a uterus measuring 15.7 x 13.2 x 11 cm.  The right ovary had some worrisome appearance with multiple septations and clusters of cysts and soft tissue nodules with internal vascularity and the ovary measured 9.1 x 8.8 x 8.9 cm.  There was a large complex mass within the endometrium measuring 10.6 x 8.8 cm.  This was felt to be either  degenerating fibroid or an hematometra.  Multiple other fibroids were noted including 2 that measured greater than 4 cm and 1 that measured 2.8 cm.  An endometrial biopsy was taken at that same visit which the patient reported was benign.  She had labs drawn in the office with a resulting hemoglobin of less than 5 mg/dL.  This prompted her being sent to the emergency  department where she received 2 units of packed red blood cells.  She was also prescribed iron replacement therapy upon the diagnosis of her anemia.  She could not immediately fill this prescription due to lack of finances.  Her medical history is most significant for frequent nose bleeds.  She has never had surgery (though did require cautery for nose bleeds).  Her gynecologic history is remarkable for menorrhagia, dysmenorrhea, G0. She does not desire future fertility.  Her family cancer history is a mother with stage IV breast cancer (diagnosed age 55).  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 She lives with her mother and is her caregiver.   Current Meds:  Outpatient Encounter Medications as of 12/13/2020  Medication Sig   ferrous sulfate 325 (65 FE) MG tablet Take 1 tablet (325 mg total) by mouth daily with breakfast.   No facility-administered encounter medications on file as of 12/13/2020.    Allergy:  Allergies  Allergen Reactions   Bee Pollen     Social Hx:   Social History   Socioeconomic History   Marital status: Single    Spouse name: Not on file   Number of children: Not on file   Years of education: Not on file   Highest education level: Not on file  Occupational History   Not on file  Tobacco Use   Smoking status: Every Day    Packs/day: 0.25    Years: 20.00    Pack years: 5.00    Types: Cigarettes   Smokeless tobacco: Never  Substance and Sexual Activity   Alcohol use: No   Drug use: Not on file   Sexual activity: Not on file  Other Topics Concern   Not on file  Social History Narrative   Not on file   Social Determinants of Health   Financial Resource Strain: Not on file  Food Insecurity: Not on file  Transportation Needs: Not on file  Physical Activity: Not on file  Stress: Not on file  Social Connections: Not on file  Intimate Partner Violence: Not on file    Past Surgical Hx: No past surgical history on file.  Past Medical  Hx: No past medical history on file.  Past Gynecological History:  see HPI (P0). Patient's last menstrual period was 11/30/2020.  Family Hx:  Family History  Problem Relation Age of Onset   Breast cancer Mother    Diabetes Father     Review of Systems:  Constitutional  Feels well,   ENT Normal appearing ears and nares bilaterally Skin/Breast  No rash, sores, jaundice, itching, dryness Cardiovascular  No chest pain, shortness of breath, or edema  Pulmonary  No cough or wheeze.  Gastro Intestinal  No nausea, vomitting, or diarrhoea. No bright red blood per rectum, no abdominal pain, change in bowel movement, or constipation.  Genito Urinary  No frequency, urgency, dysuria, + pelvic pain, menorrhagia, dysmenorrhea Musculo Skeletal  No myalgia, arthralgia, joint swelling or pain  Neurologic  No weakness, numbness, change in gait,  Psychology  No depression, anxiety, insomnia.   Vitals:  Blood pressure 110/74,  pulse 63, temperature 97.9 F (36.6 C), temperature source Tympanic, resp. rate 18, height 5\' 5"  (1.651 m), weight 112 lb 6.4 oz (51 kg), last menstrual period 11/30/2020, SpO2 100 %.  Physical Exam: WD in NAD Neck  Supple NROM, without any enlargements.  Lymph Node Survey No cervical supraclavicular or inguinal adenopathy Cardiovascular  Well perfused peripheries Lungs  No increased WOB Skin  No rash/lesions/breakdown  Psychiatry  Alert and oriented to person, place, and time  Abdomen  Normoactive bowel sounds, abdomen soft, and very thin and narrow without evidence of hernia. There is a large, mobile, mildly tender mass filling the pelvis and extending to the level of the umbilicus, right greater than left.  Back No CVA tenderness Genito Urinary  Vulva/vagina: Normal external female genitalia.  No lesions. No discharge or bleeding.  Bladder/urethra:  No lesions or masses, well supported bladder  Vagina: normal mucosa  Cervix: Tucked behind pubic  symphysis. Normal appearing, no lesions.  Uterus:  bulky, extends into upper pelvis/abdomen, deviated anteriorly, lower uterine segment mobile and free of the sidewalls. No palpable lower uterine segment or cervical fibroid.  Adnexa: unable to discretely palpate a right ovarian mass separate from the uterus. Rectal  Good tone, no masses no cul de sac nodularity.  Extremities  No bilateral cyanosis, clubbing or edema.  60 minutes of total time was spent for this patient encounter, including preparation, face-to-face counseling with the patient and coordination of care, review of imaging (results and images), communication with the referring provider and documentation of the encounter.   Mackenzie Solo, MD  12/13/2020, 10:16 AM

## 2020-12-13 NOTE — Patient Instructions (Addendum)
-  Plan to have repeat lab work today and we will contact you with the results. We have scheduled an MRI for this Friday at [facility] will check in at admitting at 6:30 pm for a 7 pm appointment.   -We have also ordered a lupron injection that we will get prior authorized with your insurance. Once this has been achieved, we will contact you to arrange for the lupron injection here at the [facility].   -We will bring you back closer to your surgery date (planning for February 22, 2021) in August to have a preop appointment with Joylene John NP. At the visit, we will discuss your surgery in detail. You may receive a phone call from the hospital as well to arrange for a preop appointment at the hospital prior to surgery.   -You will need to continue taking the iron tablets for the next three months. If you need a refill, please call the office and we will send it in for you.

## 2020-12-14 ENCOUNTER — Encounter: Payer: Self-pay | Admitting: Gynecologic Oncology

## 2020-12-14 LAB — CA 125: Cancer Antigen (CA) 125: 13 U/mL (ref 0.0–38.1)

## 2020-12-16 ENCOUNTER — Telehealth: Payer: Self-pay

## 2020-12-16 NOTE — Telephone Encounter (Signed)
Notified patient CA 125 is within normal range. CBC showed hemoglobin increased to 7 but is still low and to continue taking iron supplement. Patient verbalized understanding and reconfirmed her MRI for tomorrow. She will call with any questions.

## 2020-12-17 ENCOUNTER — Ambulatory Visit (HOSPITAL_COMMUNITY)
Admission: RE | Admit: 2020-12-17 | Discharge: 2020-12-17 | Disposition: A | Discharge status: Home / Self Care | Payer: BC Managed Care – PPO | Source: Ambulatory Visit | Attending: Gynecologic Oncology | Admitting: Gynecologic Oncology

## 2020-12-17 DIAGNOSIS — R1900 Intra-abdominal and pelvic swelling, mass and lump, unspecified site: Secondary | ICD-10-CM | POA: Insufficient documentation

## 2020-12-17 MED ORDER — GADOBUTROL 1 MMOL/ML IV SOLN
5.0000 mL | Freq: Once | INTRAVENOUS | Status: AC | PRN
  Administered 2020-12-17: 5 mL via INTRAVENOUS

## 2020-12-21 ENCOUNTER — Telehealth: Payer: Self-pay

## 2020-12-21 DIAGNOSIS — D259 Leiomyoma of uterus, unspecified: Secondary | ICD-10-CM

## 2020-12-21 NOTE — Telephone Encounter (Signed)
Ms Cavallero's Lupron injection has been prior authorized per [employer]. Pt scheduled for a urine pregnancy test stat on 12-24-20 at 2 pm and injection at 2:20 pm.Pt verbalized understanding. [facility] pharmacy said that the [place] would be available.

## 2020-12-24 ENCOUNTER — Inpatient Hospital Stay: Payer: BC Managed Care – PPO

## 2020-12-24 ENCOUNTER — Telehealth: Payer: Self-pay

## 2020-12-24 NOTE — Telephone Encounter (Signed)
Received message from patient stating she will not be having her lupron injection today 12/24/20. Returned patient call, she is requesting more clarification about her treatment plan. She does not understand why she is getting lupron injections and she is unsure of having the hysterectomy vs. having fibroids removed. She is scared and confused and does not feel comfortable proceeding with her injection today. Validated patients feelings. Instructed that appointment for today's injection will be cancelled and that Dr. Denman George will be notified. Someone from our office will contact her on Tuesday 7/5. Patient verbalized understanding and is in agreement.

## 2020-12-28 ENCOUNTER — Encounter: Payer: Self-pay | Admitting: Gynecologic Oncology

## 2020-12-28 NOTE — Telephone Encounter (Signed)
Followed up with Mackenzie Bray about her plan of care. She has been scheduled for a phone visit with Dr. Denman George on Wednesday 7/13 at 3:15. Patient verbalizes understanding and is in agreement of day and time.

## 2021-01-03 ENCOUNTER — Telehealth: Payer: Self-pay | Admitting: *Deleted

## 2021-01-03 NOTE — Telephone Encounter (Signed)
Returned patient's call from the schedulers, patient canceled phone visit and scheduled injection appt

## 2021-01-05 ENCOUNTER — Ambulatory Visit: Payer: BC Managed Care – PPO | Admitting: Gynecologic Oncology

## 2021-01-07 ENCOUNTER — Inpatient Hospital Stay: Payer: BC Managed Care – PPO | Attending: Gynecologic Oncology

## 2021-01-07 ENCOUNTER — Other Ambulatory Visit: Payer: Self-pay

## 2021-01-07 VITALS — BP 128/103 | HR 102 | Resp 18

## 2021-01-07 DIAGNOSIS — Z79899 Other long term (current) drug therapy: Secondary | ICD-10-CM | POA: Diagnosis not present

## 2021-01-07 DIAGNOSIS — D259 Leiomyoma of uterus, unspecified: Secondary | ICD-10-CM | POA: Diagnosis present

## 2021-01-07 DIAGNOSIS — D500 Iron deficiency anemia secondary to blood loss (chronic): Secondary | ICD-10-CM

## 2021-01-07 DIAGNOSIS — Z5111 Encounter for antineoplastic chemotherapy: Secondary | ICD-10-CM | POA: Insufficient documentation

## 2021-01-07 MED ORDER — LEUPROLIDE ACETATE (3 MONTH) 22.5 MG IM KIT
11.2500 mg | PACK | Freq: Once | INTRAMUSCULAR | Status: AC
Start: 2021-01-07 — End: 2021-01-07
  Administered 2021-01-07: 11.25 mg via INTRAMUSCULAR
  Filled 2021-01-07: qty 22.5

## 2021-02-04 NOTE — Progress Notes (Signed)
Patient here for a pre-operative appointment prior to her scheduled surgery on February 22, 2021. She is scheduled for a robotic assisted total laparoscopic hysterectomy greater than 250 grams, bilateral salpingectomy, mini laparotomy, unilateral oophorectomy. The surgery was discussed in detail.  See after visit summary for additional details. Visual aids used to discuss items related to surgery including sequential compression stockings, foley catheter, IV pump, multi-modal pain regimen including tylenol, photo of the surgical robot, female reproductive system to discuss surgery in detail.      Discussed post-op pain management in detail including the aspects of the enhanced recovery pathway.  Advised her that a new prescription would be sent in for tramadol and it is only to be used for after her upcoming surgery.  We discussed the use of tylenol post-op and to monitor for a maximum of 4,000 mg in a 24 hour period.  Also prescribed sennakot to be used after surgery and to hold if having loose stools.  Discussed bowel regimen in detail.     Discussed the use of SCDs and measures to take at home to prevent DVT including frequent mobility.  Reportable signs and symptoms of DVT discussed. Post-operative instructions discussed and expectations for after surgery. Incisional care discussed as well including reportable signs and symptoms including erythema, drainage, wound separation.     10 minutes spent with the patient.  Verbalizing understanding of material discussed. No needs or concerns voiced at the end of the visit.   Advised patient to call for any needs.  Advised that her post-operative medications had been prescribed and could be picked up at any time.     Plan for repeat lab work today to evaluate CBC. She has been taking oral iron for the past three months. We will contact her with the results. She reports having light vaginal spotting since last week but denies heavy bleeding.   This appointment is  included in the global surgical bundle as pre-operative teaching and has no charge.

## 2021-02-04 NOTE — Patient Instructions (Signed)
Preparing for your Surgery  Plan for surgery on February 22, 2021 with Dr. Everitt Amber at [place] will be scheduled for a robotic assisted total laparoscopic hysterectomy (removal of the uterus and cervix), bilateral salpingectomy (removal of both fallopian tubes), mini laparotomy (larger incision on your abdomen to remove the specimens through), unilateral oophorectomy (removal of one ovary with the complex cyst).    Pre-operative Testing -You will receive a phone call from presurgical testing at [facility] to arrange for a pre-operative appointment and lab work.  -Bring your insurance card, copy of an advanced directive if applicable, medication list  -At that visit, you will be asked to sign a consent for a possible blood transfusion in case a transfusion becomes necessary during surgery.  The need for a blood transfusion is rare but having consent is a necessary part of your care.     -You should not be taking blood thinners or aspirin at least ten days prior to surgery unless instructed by your surgeon.  -Do not take supplements such as fish oil (omega 3), red yeast rice, turmeric before your surgery. You want to avoid medications with aspirin in them including headache powders such as BC or Goody's), Excedrin migraine.  Day Before Surgery at [facility] will be asked to take in a light diet the day before surgery. You will be advised you can have clear liquids up until 3 hours before your surgery.    Eat a light diet the day before surgery.  Examples including soups, broths, toast, yogurt, mashed potatoes.  AVOID GAS PRODUCING FOODS. Things to avoid include carbonated beverages (fizzy beverages, sodas), raw fruits and raw vegetables (uncooked), or beans.   If your bowels are filled with gas, your surgeon will have difficulty visualizing your pelvic organs which increases your surgical risks.  Your role in recovery Your role is to become active as soon as directed  by your doctor, while still giving yourself time to heal.  Rest when you feel tired. You will be asked to do the following in order to speed your recovery:  - Cough and breathe deeply. This helps to clear and expand your lungs and can prevent pneumonia after surgery.  - [place]. Do mild physical activity. Walking or moving your legs help your circulation and body functions return to normal. Do not try to get up or walk alone the first time after surgery.   -If you develop swelling on one leg or the other, pain in the back of your leg, redness/warmth in one of your legs, please call the office or go to the Emergency Room to have a doppler to rule out a blood clot. For shortness of breath, chest pain-seek care in the Emergency Room as soon as possible. - Actively manage your pain. Managing your pain lets you move in comfort. We will ask you to rate your pain on a scale of zero to 10. It is your responsibility to tell your doctor or nurse where and how much you hurt so your pain can be treated.  Special Considerations -If you are diabetic, you may be placed on insulin after surgery to have closer control over your blood sugars to promote healing and recovery.  This does not mean that you will be discharged on insulin.  If applicable, your oral antidiabetics will be resumed when you are tolerating a solid diet.  -Your final pathology results from surgery should be available around one week after  surgery and the results will be relayed to you when available.  -Dr. Lahoma Crocker is the surgeon that assists your GYN Oncologist with surgery.  If you end up staying the night, the next day after your surgery you will either see Dr. Denman George, Dr. Berline Lopes, or Dr. Lahoma Crocker.  -FMLA forms can be faxed to [phone] and please allow 5-7 business days for completion.  Pain Management After Surgery -You have been prescribed your pain medication and bowel regimen medications before  surgery so that you can have these available when you are discharged from the hospital. The pain medication is for use ONLY AFTER surgery and a new prescription will not be given.   -Make sure that you have Tylenol and Ibuprofen at home to use on a regular basis after surgery for pain control. We recommend alternating the medications every hour to six hours since they work differently and are processed in the body differently for pain relief.  -Review the attached handout on narcotic use and their risks and side effects.   Bowel Regimen -You have been prescribed Sennakot-S to take nightly to prevent constipation especially if you are taking the narcotic pain medication intermittently.  It is important to prevent constipation and drink adequate amounts of liquids. You can stop taking this medication when you are not taking pain medication and you are back on your normal bowel routine.  Risks of Surgery Risks of surgery are low but include bleeding, infection (you will receive antibiotics during surgery to help decrease this risk), damage to surrounding structures, re-operation, blood clots, and very rarely death.   Blood Transfusion Information (For the consent to be signed before surgery)  We will be checking your blood type before surgery so in case of emergencies, we will know what type of blood you would need.                                            WHAT IS A BLOOD TRANSFUSION?  A transfusion is the replacement of blood or some of its parts. Blood is made up of multiple cells which provide different functions. Red blood cells carry oxygen and are used for blood loss replacement. White blood cells fight against infection. Platelets control bleeding. Plasma helps clot blood. Other blood products are available for specialized needs, such as hemophilia or other clotting disorders. BEFORE THE TRANSFUSION  Who gives blood for transfusions?  You may be able to donate blood to be used at a  later date on yourself (autologous donation). Relatives can be asked to donate blood. This is generally not any safer than if you have received blood from a stranger. The same precautions are taken to ensure safety when a relative's blood is donated. Healthy volunteers who are fully evaluated to make sure their blood is safe. This is blood bank blood. Transfusion therapy is the safest it has ever been in the practice of medicine. Before blood is taken from a donor, a complete history is taken to make sure that person has no history of diseases nor engages in risky social behavior (examples are intravenous drug use or sexual activity with multiple partners). The donor's travel history is screened to minimize risk of transmitting infections, such as malaria. The donated blood is tested for signs of infectious diseases, such as HIV and hepatitis. The blood is then tested to be sure it is compatible  with you in order to minimize the chance of a transfusion reaction. If you or a relative donates blood, this is often done in anticipation of surgery and is not appropriate for emergency situations. It takes many days to process the donated blood. RISKS AND COMPLICATIONS Although transfusion therapy is very safe and saves many lives, the main dangers of transfusion include:  Getting an infectious disease. Developing a transfusion reaction. This is an allergic reaction to something in the blood you were given. Every precaution is taken to prevent this. The decision to have a blood transfusion has been considered carefully by your caregiver before blood is given. Blood is not given unless the benefits outweigh the risks.  AFTER SURGERY INSTRUCTIONS  Return to work: 4 weeks if applicable  You will have a white honeycomb dressing over your larger incision. This dressing can be removed 5 days after surgery and you do not need to reapply a new dressing. Once you remove the dressing, you will notice that you have the  surgical glue (dermabond) on the incision and this will peel off on its own. You can get this dressing wet in the shower the days after surgery prior to removal on the 5th day.   Activity: 1. Be up and out of the bed during the day.  Take a nap if needed.  You may walk up steps but be careful and use the hand rail.  Stair climbing will tire you more than you think, you may need to stop part way and rest.   2. No lifting or straining for 6 weeks over 10 pounds. No pushing, pulling, straining for 6 weeks.  3. No driving for around 1 week(s).  Do not drive if you are taking narcotic pain medicine and make sure that your reaction time has returned.   4. You can shower as soon as the next day after surgery. Shower daily.  Use your regular soap and water (not directly on the incision) and pat your incision(s) dry afterwards; don't rub.  No tub baths or submerging your body in water until cleared by your surgeon. If you have the soap that was given to you by pre-surgical testing that was used before surgery, you do not need to use it afterwards because this can irritate your incisions.   5. No sexual activity and nothing in the vagina for 8 weeks.  6. You may experience a small amount of clear drainage from your incisions, which is normal.  If the drainage persists, increases, or changes color please call the office.  7. Do not use creams, lotions, or ointments such as neosporin on your incisions after surgery until advised by your surgeon because they can cause removal of the dermabond glue on your incisions.    8. You may experience vaginal spotting after surgery or around the 6-8 week mark from surgery when the stitches at the top of the vagina begin to dissolve.  The spotting is normal but if you experience heavy bleeding, call our office.  9. Take Tylenol or ibuprofen first for pain and only use narcotic pain medication for severe pain not relieved by the Tylenol or Ibuprofen.  Monitor your Tylenol  intake to a max of 4,000 mg in a 24 hour period. You can alternate these medications after surgery.  Diet: 1. Low sodium Heart Healthy Diet is recommended but you are cleared to resume your normal (before surgery) diet after your procedure.  2. It is safe to use a laxative, such as  Miralax or Colace, if you have difficulty moving your bowels. You have been prescribed Sennakot at bedtime every evening to keep bowel movements regular and to prevent constipation.    Wound Care: 1. Keep clean and dry.  Shower daily.  Reasons to call the Doctor: Fever - Oral temperature greater than 100.4 degrees Fahrenheit Foul-smelling vaginal discharge Difficulty urinating Nausea and vomiting Increased pain at the site of the incision that is unrelieved with pain medicine. Difficulty breathing with or without chest pain New calf pain especially if only on one side Sudden, continuing increased vaginal bleeding with or without clots.   Contacts: For questions or concerns you should contact:  Dr. Everitt Amber at [phone]  Joylene John, NP at [phone]  After Hours: call [phone] and have the GYN Oncologist paged/contacted (after 5 pm or on the weekends).  Messages sent via mychart are for non-urgent matters and are not responded to after hours so for urgent needs, please call the after hours number.

## 2021-02-09 ENCOUNTER — Telehealth: Payer: Self-pay

## 2021-02-09 NOTE — Telephone Encounter (Signed)
FMLA paperwork successfully faxed to [facility]. Confirmation received.

## 2021-02-16 ENCOUNTER — Other Ambulatory Visit: Payer: Self-pay

## 2021-02-16 ENCOUNTER — Inpatient Hospital Stay: Payer: BC Managed Care – PPO

## 2021-02-16 ENCOUNTER — Inpatient Hospital Stay: Payer: BC Managed Care – PPO | Attending: Gynecologic Oncology | Admitting: Gynecologic Oncology

## 2021-02-16 ENCOUNTER — Telehealth: Payer: Self-pay | Admitting: Gynecologic Oncology

## 2021-02-16 VITALS — BP 154/96 | HR 68 | Temp 98.3°F | Resp 16 | Ht 65.0 in | Wt 109.1 lb

## 2021-02-16 DIAGNOSIS — D259 Leiomyoma of uterus, unspecified: Secondary | ICD-10-CM | POA: Insufficient documentation

## 2021-02-16 DIAGNOSIS — N83291 Other ovarian cyst, right side: Secondary | ICD-10-CM

## 2021-02-16 LAB — COMPREHENSIVE METABOLIC PANEL
ALT: 7 U/L (ref 0–44)
AST: 14 U/L — ABNORMAL LOW (ref 15–41)
Albumin: 3.9 g/dL (ref 3.5–5.0)
Alkaline Phosphatase: 44 U/L (ref 38–126)
Anion gap: 9 (ref 5–15)
BUN: 13 mg/dL (ref 6–20)
CO2: 23 mmol/L (ref 22–32)
Calcium: 9.1 mg/dL (ref 8.9–10.3)
Chloride: 109 mmol/L (ref 98–111)
Creatinine, Ser: 0.79 mg/dL (ref 0.44–1.00)
GFR, Estimated: >60 mL/min (ref >60)
Glucose, Bld: 70 mg/dL (ref 70–99)
Potassium: 3.9 mmol/L (ref 3.5–5.1)
Sodium: 141 mmol/L (ref 135–145)
Total Bilirubin: 0.2 mg/dL — ABNORMAL LOW (ref 0.3–1.2)
Total Protein: 8.1 g/dL (ref 6.5–8.1)

## 2021-02-16 LAB — CBC WITH DIFFERENTIAL (CANCER CENTER ONLY)
Abs Immature Granulocytes: 0.02 10*3/uL (ref 0.00–0.07)
Basophils Absolute: 0 10*3/uL (ref 0.0–0.1)
Basophils Relative: 1 %
Eosinophils Absolute: 0.3 10*3/uL (ref 0.0–0.5)
Eosinophils Relative: 5 %
HCT: 42.5 % (ref 36.0–46.0)
Hemoglobin: 12.2 g/dL (ref 12.0–15.0)
Immature Granulocytes: 0 %
Lymphocytes Relative: 27 %
Lymphs Abs: 1.5 10*3/uL (ref 0.7–4.0)
MCH: 18.6 pg — ABNORMAL LOW (ref 26.0–34.0)
MCHC: 28.7 g/dL — ABNORMAL LOW (ref 30.0–36.0)
MCV: 64.8 fL — ABNORMAL LOW (ref 80.0–100.0)
Monocytes Absolute: 0.5 10*3/uL (ref 0.1–1.0)
Monocytes Relative: 8 %
Neutro Abs: 3.4 10*3/uL (ref 1.7–7.7)
Neutrophils Relative %: 59 %
Platelet Count: 221 10*3/uL (ref 150–400)
RBC: 6.56 MIL/uL — ABNORMAL HIGH (ref 3.87–5.11)
RDW: 29.7 % — ABNORMAL HIGH (ref 11.5–15.5)
WBC Count: 5.7 10*3/uL (ref 4.0–10.5)
nRBC: 0 % (ref 0.0–0.2)

## 2021-02-16 MED ORDER — IBUPROFEN 800 MG PO TABS: 800.0000 mg | ORAL_TABLET | Freq: Three times a day (TID) | ORAL | 0 refills | Status: DC | PRN

## 2021-02-16 MED ORDER — TRAMADOL HCL 50 MG PO TABS: 50.0000 mg | ORAL_TABLET | Freq: Four times a day (QID) | ORAL | 0 refills | Status: DC | PRN

## 2021-02-16 MED ORDER — SENNOSIDES-DOCUSATE SODIUM 8.6-50 MG PO TABS: 2.0000 | ORAL_TABLET | Freq: Every day | ORAL | 0 refills | Status: DC

## 2021-02-16 NOTE — Telephone Encounter (Signed)
Called patient and informed her of her recent lab work. Advised to call for any needs.

## 2021-02-17 NOTE — Patient Instructions (Addendum)
DUE TO COVID-19 ONLY ONE VISITOR IS ALLOWED TO COME WITH YOU AND STAY IN THE WAITING ROOM ONLY DURING PRE OP AND PROCEDURE DAY OF SURGERY. THE 1 VISITOR  MAY VISIT WITH YOU AFTER SURGERY IN YOUR PRIVATE ROOM DURING VISITING HOURS ONLY!               Mackenzie Bray     Your procedure is scheduled on: 02/22/21   Report to [facility] Main  Entrance   Report to short stay at 5:15 AM     Call this number if you have problems the morning of surgery [phone]   The day before surgery eat a light diet. Full Liquid Diet   Strained creamy soups Tea, Coffee- with cream or mild and sugar or honey  Juices- cranberry , grape and apple  Jello  Milkshakes  Pudding , custards  Popsicles  Water Plain ice cream f, frozen yogurt, sherbet, plain yogurt  Fruit ices and popsicles with no fruit pulp  Sugar, honey and syrups Clear broths  Boost, Ensure, Resource and other liquid supplements NO CARBONATED BEVERAGES      Remember: Do not eat food after Midnight.      You may have clear liquids until 4:30 AM   BRUSH YOUR TEETH MORNING OF SURGERY AND RINSE YOUR MOUTH OUT, NO CHEWING GUM CANDY OR MINTS.     Take these medicines the morning of surgery with A SIP OF WATER: none                                 You may not have any metal on your body including hair pins and              piercings  Do not wear jewelry, make-up, lotions, powders or perfumes, deodorant             Do not wear nail polish on your fingernails.  Do not shave  48 hours prior to surgery.                 Do not bring valuables to the hospital. [place].  Contacts, dentures or bridgework may not be worn into surgery.  Leave suitcase in the car. After surgery it may be brought to your room.     Patients discharged the day of surgery will not be allowed to drive home.   IF YOU ARE HAVING SURGERY AND GOING HOME THE SAME DAY, YOU MUST HAVE AN ADULT TO DRIVE  YOU HOME AND BE WITH YOU FOR 24 HOURS.   YOU MAY GO HOME BY TAXI OR UBER OR ORTHERWISE, BUT AN ADULT MUST ACCOMPANY YOU HOME AND STAY WITH YOU FOR 24 HOURS.  Name and phone number of your driver:  Special Instructions: N/A              Please read over the following fact sheets you were given: _____________________________________________________________________             [facility] - Preparing for Surgery Before surgery, you can play an important role.  Because skin is not sterile, your skin needs to be as free of germs as possible.  You can reduce the number of germs on your skin by washing with CHG (chlorahexidine gluconate) soap before surgery.  CHG is an antiseptic cleaner which  kills germs and bonds with the skin to continue killing germs even after washing. Please DO NOT use if you have an allergy to CHG or antibacterial soaps.  If your skin becomes reddened/irritated stop using the CHG and inform your nurse when you arrive at Short Stay. Do not shave (including legs and underarms) for at least 48 hours prior to the first CHG shower.  Please follow these instructions carefully:  1.  Shower with CHG Soap the night before surgery and the  morning of Surgery.  2.  If you choose to wash your hair, wash your hair first as usual with your  normal  shampoo.  3.  After you shampoo, rinse your hair and body thoroughly to remove the  shampoo.                                4.  Use CHG as you would any other liquid soap.  You can apply chg directly  to the skin and wash                       Gently with a scrungie or clean washcloth.  5.  Apply the CHG Soap to your body ONLY FROM THE NECK DOWN.   Do not use on face/ open                           Wound or open sores. Avoid contact with eyes, ears mouth and genitals (private parts).                       Wash face,  Genitals (private parts) with your normal soap.             6.  Wash thoroughly, paying special attention to the area where your  surgery  will be performed.  7.  Thoroughly rinse your body with warm water from the neck down.  8.  DO NOT shower/wash with your normal soap after using and rinsing off  the CHG Soap.             9.  Pat yourself dry with a clean towel.            10.  Wear clean pajamas.            11.  Place clean sheets on your bed the night of your first shower and do not  sleep with pets. Day of Surgery : Do not apply any lotions/deodorants the morning of surgery.  Please wear clean clothes to the hospital/surgery center.  FAILURE TO FOLLOW THESE INSTRUCTIONS MAY RESULT IN THE CANCELLATION OF YOUR SURGERY PATIENT SIGNATURE_________________________________  NURSE SIGNATURE__________________________________  WHAT IS A BLOOD TRANSFUSION? Blood Transfusion Information  A transfusion is the replacement of blood or some of its parts. Blood is made up of multiple cells which provide different functions. Red blood cells carry oxygen and are used for blood loss replacement. White blood cells fight against infection. Platelets control bleeding. Plasma helps clot blood. Other blood products are available for specialized needs, such as hemophilia or other clotting disorders. BEFORE THE TRANSFUSION  Who gives blood for transfusions?  Healthy volunteers who are fully evaluated to make sure their blood is safe. This is blood bank blood. Transfusion therapy is the safest it has ever been in the practice of medicine. Before blood is taken from a  donor, a complete history is taken to make sure that person has no history of diseases nor engages in risky social behavior (examples are intravenous drug use or sexual activity with multiple partners). The donor's travel history is screened to minimize risk of transmitting infections, such as malaria. The donated blood is tested for signs of infectious diseases, such as HIV and hepatitis. The blood is then tested to be sure it is compatible with you in order to minimize the  chance of a transfusion reaction. If you or a relative donates blood, this is often done in anticipation of surgery and is not appropriate for emergency situations. It takes many days to process the donated blood. RISKS AND COMPLICATIONS Although transfusion therapy is very safe and saves many lives, the main dangers of transfusion include:  Getting an infectious disease. Developing a transfusion reaction. This is an allergic reaction to something in the blood you were given. Every precaution is taken to prevent this. The decision to have a blood transfusion has been considered carefully by your caregiver before blood is given. Blood is not given unless the benefits outweigh the risks. AFTER THE TRANSFUSION Right after receiving a blood transfusion, you will usually feel much better and more energetic. This is especially true if your red blood cells have gotten low (anemic). The transfusion raises the level of the red blood cells which carry oxygen, and this usually causes an energy increase. The nurse administering the transfusion will monitor you carefully for complications. HOME CARE INSTRUCTIONS  No special instructions are needed after a transfusion. You may find your energy is better. Speak with your caregiver about any limitations on activity for underlying diseases you may have. SEEK MEDICAL CARE IF:  Your condition is not improving after your transfusion. You develop redness or irritation at the intravenous (IV) site. SEEK IMMEDIATE MEDICAL CARE IF:  Any of the following symptoms occur over the next 12 hours: Shaking chills. You have a temperature by mouth above 102 F (38.9 C), not controlled by medicine. Chest, back, or muscle pain. People around you feel you are not acting correctly or are confused. Shortness of breath or difficulty breathing. Dizziness and fainting. You get a rash or develop hives. You have a decrease in urine output. Your urine turns a dark color or changes to  pink, red, or brown. Any of the following symptoms occur over the next 10 days: You have a temperature by mouth above 102 F (38.9 C), not controlled by medicine. Shortness of breath. Weakness after normal activity. The white part of the eye turns yellow (jaundice). You have a decrease in the amount of urine or are urinating less often. Your urine turns a dark color or changes to pink, red, or brown. Document Released: 06/09/2000 Document Revised: 09/04/2011 Document Reviewed: 01/27/2008 [facility] Patient Information 2014 [employer], [place].  _______________________________________________________________________  ________________________________________________________________________

## 2021-02-18 ENCOUNTER — Encounter (HOSPITAL_COMMUNITY)
Admission: RE | Admit: 2021-02-18 | Discharge: 2021-02-18 | Disposition: A | Discharge status: Home / Self Care | Payer: BC Managed Care – PPO | Source: Ambulatory Visit | Attending: Gynecologic Oncology | Admitting: Gynecologic Oncology

## 2021-02-18 ENCOUNTER — Other Ambulatory Visit: Payer: Self-pay

## 2021-02-18 ENCOUNTER — Encounter: Payer: Self-pay | Admitting: Gynecologic Oncology

## 2021-02-18 ENCOUNTER — Encounter (HOSPITAL_COMMUNITY): Payer: Self-pay

## 2021-02-18 DIAGNOSIS — Z01812 Encounter for preprocedural laboratory examination: Secondary | ICD-10-CM | POA: Diagnosis not present

## 2021-02-18 HISTORY — DX: Anemia, unspecified: D64.9

## 2021-02-18 NOTE — Progress Notes (Addendum)
COVID test Completed: NA  PCP - none Cardiologist -   Chest x-ray - no EKG - no Stress Test - no ECHO - no Cardiac Cath - no Pacemaker/ICD device last checked:NA  Sleep Study - no CPAP -   Fasting Blood Sugar - NA Checks Blood Sugar _____ times a day  Blood Thinner Instructions:NA Aspirin Instructions: Last Dose:  Anesthesia review: no  Patient denies shortness of breath, fever, cough and chest pain at PAT appointment. Yes No SOB with any activities  Patient verbalized understanding of instructions that were given to them at the PAT appointment. Patient was also instructed that they will need to review over the PAT instructions again at home before surgery. Yes Pt was in pain and tearful. She said it was so hard to work in so much pain. I told her to talk to Dr. Denman George about stopping work and managing her pain.

## 2021-02-21 ENCOUNTER — Telehealth: Payer: Self-pay

## 2021-02-21 NOTE — Telephone Encounter (Signed)
Telephone call to check on pre-operative status.  Patient compliant with pre-operative instructions.  Reinforced NPO after midnight.  No questions or concerns voiced.  Instructed to call for any needs.   

## 2021-02-22 ENCOUNTER — Ambulatory Visit (HOSPITAL_COMMUNITY)
Admission: RE | Admit: 2021-02-22 | Discharge: 2021-02-22 | Disposition: A | Discharge status: Home / Self Care | Payer: BC Managed Care – PPO | Attending: Gynecologic Oncology | Admitting: Gynecologic Oncology

## 2021-02-22 ENCOUNTER — Ambulatory Visit (HOSPITAL_COMMUNITY): Payer: BC Managed Care – PPO | Admitting: Certified Registered Nurse Anesthetist

## 2021-02-22 ENCOUNTER — Encounter (HOSPITAL_COMMUNITY): Payer: Self-pay | Admitting: Gynecologic Oncology

## 2021-02-22 ENCOUNTER — Encounter (HOSPITAL_COMMUNITY)
Admission: RE | Disposition: A | Discharge status: Home / Self Care | Payer: Self-pay | Source: Home / Self Care | Attending: Gynecologic Oncology

## 2021-02-22 DIAGNOSIS — D62 Acute posthemorrhagic anemia: Secondary | ICD-10-CM | POA: Insufficient documentation

## 2021-02-22 DIAGNOSIS — D251 Intramural leiomyoma of uterus: Secondary | ICD-10-CM | POA: Insufficient documentation

## 2021-02-22 DIAGNOSIS — D252 Subserosal leiomyoma of uterus: Secondary | ICD-10-CM

## 2021-02-22 DIAGNOSIS — N83291 Other ovarian cyst, right side: Secondary | ICD-10-CM | POA: Diagnosis present

## 2021-02-22 DIAGNOSIS — D250 Submucous leiomyoma of uterus: Secondary | ICD-10-CM | POA: Diagnosis not present

## 2021-02-22 DIAGNOSIS — F17210 Nicotine dependence, cigarettes, uncomplicated: Secondary | ICD-10-CM | POA: Diagnosis not present

## 2021-02-22 DIAGNOSIS — D259 Leiomyoma of uterus, unspecified: Secondary | ICD-10-CM

## 2021-02-22 HISTORY — PX: ROBOTIC ASSISTED TOTAL HYSTERECTOMY WITH BILATERAL SALPINGO OOPHERECTOMY: SHX6086

## 2021-02-22 LAB — PREGNANCY, URINE: Preg Test, Ur: NEGATIVE

## 2021-02-22 SURGERY — HYSTERECTOMY, TOTAL, ROBOT-ASSISTED, LAPAROSCOPIC, WITH BILATERAL SALPINGO-OOPHORECTOMY
Anesthesia: General | Laterality: Bilateral

## 2021-02-22 MED ORDER — FENTANYL CITRATE (PF) 100 MCG/2ML IJ SOLN
INTRAMUSCULAR | Status: AC
  Filled 2021-02-22: qty 2

## 2021-02-22 MED ORDER — SUGAMMADEX SODIUM 200 MG/2ML IV SOLN
INTRAVENOUS | Status: DC | PRN
  Administered 2021-02-22: 100 mg via INTRAVENOUS

## 2021-02-22 MED ORDER — LIDOCAINE 2% (20 MG/ML) 5 ML SYRINGE
INTRAMUSCULAR | Status: DC | PRN
  Administered 2021-02-22: 80 mg via INTRAVENOUS

## 2021-02-22 MED ORDER — LIDOCAINE 2% (20 MG/ML) 5 ML SYRINGE
INTRAMUSCULAR | Status: AC
  Filled 2021-02-22: qty 5

## 2021-02-22 MED ORDER — MIDAZOLAM HCL 5 MG/5ML IJ SOLN
INTRAMUSCULAR | Status: DC | PRN
  Administered 2021-02-22: 2 mg via INTRAVENOUS

## 2021-02-22 MED ORDER — MIDAZOLAM HCL 2 MG/2ML IJ SOLN
INTRAMUSCULAR | Status: AC
  Filled 2021-02-22: qty 2

## 2021-02-22 MED ORDER — 0.9 % SODIUM CHLORIDE (POUR BTL) OPTIME
TOPICAL | Status: DC | PRN
  Administered 2021-02-22: 1000 mL

## 2021-02-22 MED ORDER — ONDANSETRON HCL 4 MG/2ML IJ SOLN
INTRAMUSCULAR | Status: AC
  Filled 2021-02-22: qty 2

## 2021-02-22 MED ORDER — ORAL CARE MOUTH RINSE: 15.0000 mL | Freq: Once | OROMUCOSAL | Status: AC

## 2021-02-22 MED ORDER — PROMETHAZINE HCL 25 MG/ML IJ SOLN: 6.2500 mg | INTRAMUSCULAR | Status: DC | PRN

## 2021-02-22 MED ORDER — ONDANSETRON HCL 4 MG/2ML IJ SOLN
INTRAMUSCULAR | Status: DC | PRN
  Administered 2021-02-22: 4 mg via INTRAVENOUS

## 2021-02-22 MED ORDER — OXYCODONE HCL 5 MG PO TABS: 5.0000 mg | ORAL_TABLET | ORAL | Status: DC | PRN

## 2021-02-22 MED ORDER — LACTATED RINGERS IV SOLN: INTRAVENOUS | Status: DC | PRN

## 2021-02-22 MED ORDER — DEXAMETHASONE SODIUM PHOSPHATE 4 MG/ML IJ SOLN
4.0000 mg | INTRAMUSCULAR | Status: DC
Start: 2021-02-22 — End: 2021-02-22

## 2021-02-22 MED ORDER — BUPIVACAINE LIPOSOME 1.3 % IJ SUSP
20.0000 mL | Freq: Once | INTRAMUSCULAR | Status: AC
  Administered 2021-02-22: 20 mL
  Filled 2021-02-22: qty 20

## 2021-02-22 MED ORDER — SODIUM CHLORIDE 0.9 % IV SOLN: 250.0000 mL | INTRAVENOUS | Status: DC | PRN

## 2021-02-22 MED ORDER — FENTANYL CITRATE PF 50 MCG/ML IJ SOSY
PREFILLED_SYRINGE | INTRAMUSCULAR | Status: AC
  Filled 2021-02-22: qty 1

## 2021-02-22 MED ORDER — KETOROLAC TROMETHAMINE 15 MG/ML IJ SOLN: 15.0000 mg | Freq: Four times a day (QID) | INTRAMUSCULAR | Status: DC

## 2021-02-22 MED ORDER — STERILE WATER FOR IRRIGATION IR SOLN
Status: DC | PRN
  Administered 2021-02-22: 1000 mL

## 2021-02-22 MED ORDER — KETOROLAC TROMETHAMINE 30 MG/ML IJ SOLN
INTRAMUSCULAR | Status: AC
  Filled 2021-02-22: qty 1

## 2021-02-22 MED ORDER — BUPIVACAINE HCL 0.25 % IJ SOLN
INTRAMUSCULAR | Status: AC
  Filled 2021-02-22: qty 1

## 2021-02-22 MED ORDER — KETOROLAC TROMETHAMINE 30 MG/ML IJ SOLN
INTRAMUSCULAR | Status: DC | PRN
  Administered 2021-02-22: 15 mg via INTRAVENOUS

## 2021-02-22 MED ORDER — CEFAZOLIN SODIUM-DEXTROSE 2-4 GM/100ML-% IV SOLN
2.0000 g | INTRAVENOUS | Status: AC
Start: 2021-02-22 — End: 2021-02-22
  Administered 2021-02-22: 2 g via INTRAVENOUS
  Filled 2021-02-22: qty 100

## 2021-02-22 MED ORDER — LACTATED RINGERS IR SOLN
Status: DC | PRN
  Administered 2021-02-22: 1000 mL

## 2021-02-22 MED ORDER — GABAPENTIN 300 MG PO CAPS
300.0000 mg | ORAL_CAPSULE | ORAL | Status: AC
  Administered 2021-02-22: 300 mg via ORAL
  Filled 2021-02-22: qty 1

## 2021-02-22 MED ORDER — ROCURONIUM BROMIDE 10 MG/ML (PF) SYRINGE
PREFILLED_SYRINGE | INTRAVENOUS | Status: DC | PRN
  Administered 2021-02-22: 5 mg via INTRAVENOUS
  Administered 2021-02-22: 50 mg via INTRAVENOUS
  Administered 2021-02-22: 20 mg via INTRAVENOUS

## 2021-02-22 MED ORDER — FENTANYL CITRATE PF 50 MCG/ML IJ SOSY
PREFILLED_SYRINGE | INTRAMUSCULAR | Status: AC
  Filled 2021-02-22: qty 2

## 2021-02-22 MED ORDER — ACETAMINOPHEN 325 MG PO TABS: 650.0000 mg | ORAL_TABLET | ORAL | Status: DC | PRN

## 2021-02-22 MED ORDER — ACETAMINOPHEN 650 MG RE SUPP: 650.0000 mg | RECTAL | Status: DC | PRN

## 2021-02-22 MED ORDER — PROPOFOL 10 MG/ML IV BOLUS
INTRAVENOUS | Status: DC | PRN
  Administered 2021-02-22: 130 mg via INTRAVENOUS

## 2021-02-22 MED ORDER — CHLORHEXIDINE GLUCONATE 0.12 % MT SOLN
15.0000 mL | Freq: Once | OROMUCOSAL | Status: AC
  Administered 2021-02-22: 15 mL via OROMUCOSAL

## 2021-02-22 MED ORDER — PHENYLEPHRINE 40 MCG/ML (10ML) SYRINGE FOR IV PUSH (FOR BLOOD PRESSURE SUPPORT)
PREFILLED_SYRINGE | INTRAVENOUS | Status: AC
  Filled 2021-02-22: qty 10

## 2021-02-22 MED ORDER — FENTANYL CITRATE (PF) 100 MCG/2ML IJ SOLN
INTRAMUSCULAR | Status: DC | PRN
  Administered 2021-02-22 (×2): 50 ug via INTRAVENOUS
  Administered 2021-02-22: 100 ug via INTRAVENOUS

## 2021-02-22 MED ORDER — PROPOFOL 10 MG/ML IV BOLUS
INTRAVENOUS | Status: AC
  Filled 2021-02-22: qty 40

## 2021-02-22 MED ORDER — EPHEDRINE SULFATE-NACL 50-0.9 MG/10ML-% IV SOSY
PREFILLED_SYRINGE | INTRAVENOUS | Status: DC | PRN
  Administered 2021-02-22: 5 mg via INTRAVENOUS

## 2021-02-22 MED ORDER — FENTANYL CITRATE PF 50 MCG/ML IJ SOSY
25.0000 ug | PREFILLED_SYRINGE | INTRAMUSCULAR | Status: DC | PRN
  Administered 2021-02-22: 50 ug via INTRAVENOUS
  Administered 2021-02-22 (×2): 25 ug via INTRAVENOUS
  Administered 2021-02-22: 50 ug via INTRAVENOUS

## 2021-02-22 MED ORDER — LIDOCAINE HCL (PF) 2 % IJ SOLN
INTRAMUSCULAR | Status: DC | PRN
  Administered 2021-02-22: 1.5 mg/kg/h via INTRADERMAL

## 2021-02-22 MED ORDER — ACETAMINOPHEN 500 MG PO TABS
1000.0000 mg | ORAL_TABLET | ORAL | Status: AC
  Administered 2021-02-22: 1000 mg via ORAL
  Filled 2021-02-22: qty 2

## 2021-02-22 MED ORDER — SCOPOLAMINE 1 MG/3DAYS TD PT72
1.0000 | MEDICATED_PATCH | TRANSDERMAL | Status: DC
  Administered 2021-02-22: 1.5 mg via TRANSDERMAL
  Filled 2021-02-22: qty 1

## 2021-02-22 MED ORDER — MORPHINE SULFATE (PF) 4 MG/ML IV SOLN: 2.0000 mg | INTRAVENOUS | Status: DC | PRN

## 2021-02-22 MED ORDER — DEXAMETHASONE SODIUM PHOSPHATE 10 MG/ML IJ SOLN
INTRAMUSCULAR | Status: AC
  Filled 2021-02-22: qty 1

## 2021-02-22 MED ORDER — PHENYLEPHRINE 40 MCG/ML (10ML) SYRINGE FOR IV PUSH (FOR BLOOD PRESSURE SUPPORT)
PREFILLED_SYRINGE | INTRAVENOUS | Status: DC | PRN
  Administered 2021-02-22 (×2): 80 ug via INTRAVENOUS
  Administered 2021-02-22: 40 ug via INTRAVENOUS

## 2021-02-22 MED ORDER — LACTATED RINGERS IV SOLN: INTRAVENOUS | Status: DC

## 2021-02-22 MED ORDER — SODIUM CHLORIDE 0.9% FLUSH: 3.0000 mL | Freq: Two times a day (BID) | INTRAVENOUS | Status: DC

## 2021-02-22 MED ORDER — KETAMINE HCL 10 MG/ML IJ SOLN
INTRAMUSCULAR | Status: AC
  Filled 2021-02-22: qty 1

## 2021-02-22 MED ORDER — DEXAMETHASONE SODIUM PHOSPHATE 10 MG/ML IJ SOLN
INTRAMUSCULAR | Status: DC | PRN
Start: 2021-02-22 — End: 2021-02-22
  Administered 2021-02-22: 10 mg via INTRAVENOUS

## 2021-02-22 MED ORDER — SODIUM CHLORIDE 0.9% FLUSH: 3.0000 mL | INTRAVENOUS | Status: DC | PRN

## 2021-02-22 MED ORDER — BUPIVACAINE HCL 0.25 % IJ SOLN
INTRAMUSCULAR | Status: DC | PRN
  Administered 2021-02-22: 20 mL

## 2021-02-22 MED ORDER — KETOROLAC TROMETHAMINE 15 MG/ML IJ SOLN
15.0000 mg | INTRAMUSCULAR | Status: DC
Start: 2021-02-22 — End: 2021-02-22

## 2021-02-22 MED ORDER — KETAMINE HCL 10 MG/ML IJ SOLN
INTRAMUSCULAR | Status: DC | PRN
  Administered 2021-02-22: 20 mg via INTRAVENOUS

## 2021-02-22 SURGICAL SUPPLY — 82 items
ADH SKN CLS APL DERMABOND .7 (GAUZE/BANDAGES/DRESSINGS) ×2
AGENT HMST KT MTR STRL THRMB (HEMOSTASIS)
APL ESCP 34 STRL LF DISP (HEMOSTASIS)
APPLICATOR SURGIFLO ENDO (HEMOSTASIS) IMPLANT
BAG COUNTER SPONGE SURGICOUNT (BAG) IMPLANT
BAG LAPAROSCOPIC 12 15 PORT 16 (BASKET) IMPLANT
BAG RETRIEVAL 12/15 (BASKET)
BAG RETRIEVAL 12/15MM (BASKET)
BAG SPEC RTRVL LRG 6X4 10 (ENDOMECHANICALS)
BAG SPNG CNTER NS LX DISP (BAG)
BAG SURGICOUNT SPONGE COUNTING (BAG)
BLADE SURG SZ10 CARB STEEL (BLADE) ×2 IMPLANT
CELLS DAT CNTRL 66122 CELL SVR (MISCELLANEOUS) ×1 IMPLANT
COVER BACK TABLE 60X90IN (DRAPES) ×3 IMPLANT
COVER TIP SHEARS 8 DVNC (MISCELLANEOUS) ×1 IMPLANT
COVER TIP SHEARS 8MM DA VINCI (MISCELLANEOUS) ×3
DECANTER SPIKE VIAL GLASS SM (MISCELLANEOUS) IMPLANT
DERMABOND ADVANCED (GAUZE/BANDAGES/DRESSINGS) ×4
DERMABOND ADVANCED .7 DNX12 (GAUZE/BANDAGES/DRESSINGS) ×1 IMPLANT
DRAPE ARM DVNC X/XI (DISPOSABLE) ×4 IMPLANT
DRAPE COLUMN DVNC XI (DISPOSABLE) ×1 IMPLANT
DRAPE DA VINCI XI ARM (DISPOSABLE) ×12
DRAPE DA VINCI XI COLUMN (DISPOSABLE) ×3
DRAPE SHEET LG 3/4 BI-LAMINATE (DRAPES) ×3 IMPLANT
DRAPE SURG IRRIG POUCH 19X23 (DRAPES) ×3 IMPLANT
DRSG OPSITE POSTOP 4X6 (GAUZE/BANDAGES/DRESSINGS) ×2 IMPLANT
DRSG OPSITE POSTOP 4X8 (GAUZE/BANDAGES/DRESSINGS) IMPLANT
DRSG TEGADERM 6X8 (GAUZE/BANDAGES/DRESSINGS) ×2 IMPLANT
ELECT PENCIL ROCKER SW 15FT (MISCELLANEOUS) ×2 IMPLANT
ELECT REM PT RETURN 15FT ADLT (MISCELLANEOUS) ×3 IMPLANT
GAUZE 4X4 16PLY ~~LOC~~+RFID DBL (SPONGE) ×3 IMPLANT
GLOVE SURG ENC MOIS LTX SZ6 (GLOVE) ×12 IMPLANT
GLOVE SURG ENC MOIS LTX SZ6.5 (GLOVE) ×6 IMPLANT
GOWN STRL REUS W/ TWL LRG LVL3 (GOWN DISPOSABLE) ×4 IMPLANT
GOWN STRL REUS W/TWL LRG LVL3 (GOWN DISPOSABLE) ×12
HOLDER FOLEY CATH W/STRAP (MISCELLANEOUS) IMPLANT
IRRIG SUCT STRYKERFLOW 2 WTIP (MISCELLANEOUS) ×3
IRRIGATION SUCT STRKRFLW 2 WTP (MISCELLANEOUS) ×1 IMPLANT
KIT TURNOVER KIT A (KITS) ×3 IMPLANT
MANIPULATOR ADVINCU DEL 3.5 PL (MISCELLANEOUS) ×2 IMPLANT
MANIPULATOR UTERINE 4.5 ZUMI (MISCELLANEOUS) ×1 IMPLANT
NEEDLE HYPO 22GX1.5 SAFETY (NEEDLE) ×3 IMPLANT
OBTURATOR OPTICAL STANDARD 8MM (TROCAR) ×3
OBTURATOR OPTICAL STND 8 DVNC (TROCAR) ×1
OBTURATOR OPTICALSTD 8 DVNC (TROCAR) ×1 IMPLANT
PACK ROBOT GYN CUSTOM WL (TRAY / TRAY PROCEDURE) ×3 IMPLANT
PAD POSITIONING PINK XL (MISCELLANEOUS) ×3 IMPLANT
PORT ACCESS TROCAR AIRSEAL 12 (TROCAR) ×1 IMPLANT
PORT ACCESS TROCAR AIRSEAL 5M (TROCAR) ×2
POUCH SPECIMEN RETRIEVAL 10MM (ENDOMECHANICALS) IMPLANT
RETRACTOR WND ALEXIS 18 MED (MISCELLANEOUS) IMPLANT
RETRACTOR WND ALEXIS 25 LRG (MISCELLANEOUS) IMPLANT
RTRCTR WOUND ALEXIS 18CM MED (MISCELLANEOUS) ×3
RTRCTR WOUND ALEXIS 25CM LRG (MISCELLANEOUS)
SCRUB EXIDINE 4% CHG 4OZ (MISCELLANEOUS) ×3 IMPLANT
SEAL CANN UNIV 5-8 DVNC XI (MISCELLANEOUS) ×3 IMPLANT
SEAL XI 5MM-8MM UNIVERSAL (MISCELLANEOUS) ×12
SEALER VESSEL DA VINCI XI (MISCELLANEOUS) ×3
SEALER VESSEL EXT DVNC XI (MISCELLANEOUS) IMPLANT
SET TRI-LUMEN FLTR TB AIRSEAL (TUBING) ×3 IMPLANT
SPONGE T-LAP 18X18 ~~LOC~~+RFID (SPONGE) ×4 IMPLANT
SURGIFLO W/THROMBIN 8M KIT (HEMOSTASIS) IMPLANT
SUT MNCRL AB 4-0 PS2 18 (SUTURE) IMPLANT
SUT PDS AB 1 TP1 96 (SUTURE) IMPLANT
SUT VIC AB 0 CT1 27 (SUTURE) ×6
SUT VIC AB 0 CT1 27XBRD ANTBC (SUTURE) ×1 IMPLANT
SUT VIC AB 2-0 CT1 27 (SUTURE)
SUT VIC AB 2-0 CT1 TAPERPNT 27 (SUTURE) IMPLANT
SUT VIC AB 2-0 SH 27 (SUTURE) ×3
SUT VIC AB 2-0 SH 27X BRD (SUTURE) IMPLANT
SUT VIC AB 4-0 PS2 18 (SUTURE) ×6 IMPLANT
SUT VLOC 180 0 9IN  GS21 (SUTURE) ×3
SUT VLOC 180 0 9IN GS21 (SUTURE) ×1 IMPLANT
SYR 20ML LL LF (SYRINGE) IMPLANT
SYR 50ML LL SCALE MARK (SYRINGE) IMPLANT
TOWEL OR NON WOVEN STRL DISP B (DISPOSABLE) ×3 IMPLANT
TRAP SPECIMEN MUCUS 40CC (MISCELLANEOUS) ×2 IMPLANT
TRAY FOLEY MTR SLVR 16FR STAT (SET/KITS/TRAYS/PACK) ×3 IMPLANT
TROCAR XCEL NON-BLD 5MMX100MML (ENDOMECHANICALS) IMPLANT
UNDERPAD 30X36 HEAVY ABSORB (UNDERPADS AND DIAPERS) ×3 IMPLANT
WATER STERILE IRR 1000ML POUR (IV SOLUTION) ×3 IMPLANT
YANKAUER SUCT BULB TIP 10FT TU (MISCELLANEOUS) IMPLANT

## 2021-02-22 NOTE — Anesthesia Procedure Notes (Signed)
Procedure Name: Intubation Date/Time: 02/22/2021 7:30 AM Performed by: Montel Clock, CRNA Pre-anesthesia Checklist: Patient identified, Emergency Drugs available, Suction available and Patient being monitored Patient Re-evaluated:Patient Re-evaluated prior to induction Oxygen Delivery Method: Circle system utilized Preoxygenation: Pre-oxygenation with 100% oxygen Induction Type: IV induction Ventilation: Mask ventilation without difficulty Laryngoscope Size: Miller and 2 Grade View: Grade II Tube type: Oral Number of attempts: 1 Airway Equipment and Method: Stylet Placement Confirmation: ETT inserted through vocal cords under direct vision, positive ETCO2 and breath sounds checked- equal and bilateral Secured at: 22 cm Tube secured with: Tape Dental Injury: Teeth and Oropharynx as per pre-operative assessment  Comments: Performed by Loura Pardon

## 2021-02-22 NOTE — H&P (Signed)
H&P Note: Gyn-Onc  Consult was requested by Dr. Pamala Hurry for the evaluation of Khiley Cardullo 45 y.o. female  CC:  Uterine fibroids, complex adnexal mass.   Assessment/Plan:  Ms. Bostyn Brich  is a 45 y.o.  year old with a bulky fibroid uterus (16cm) with a possible right adnexal mass and possible degenerating submucosal fibroid in the setting of a bulky fibroid uterus. She has severe acute blood loss anemia requiring transfusion.  I am recommending definitive management with hysterectomy. I believe she is a good candidate for minimally invasive approach (robotic assisted TLH >250gm, bilateral salpingectomy, unilateral oophorectomy, minilaparotomy for specimen retrieval). Staging procedures would be necessary if a malignancy in the ovary was identified. I counseled her that she is at higher risk for complications due to her pre-existing anemia and the large/bulky uterine size. Risks include  bleeding, infection, damage to internal organs (such as bladder,ureters, bowels), blood clot, reoperation and rehospitalization.   HPI: Ms Claris Bigbie is a 45 year old P0 who was seen in consultation at the request of Dr Pamala Hurry for evaluation of  a complex right ovarian mass and symptomatic fibroid uterus.  The patient has had menorrhagia and dysmenorrhea symptoms since menarche.  For several preceding years prior to her presentation she was developing right-sided pelvic pain and growth in the mass in her lower abdomen.  She did not seek care for this as she lacked health insurance.  There was no acute change to either her menorrhagia or mask symptoms however when she gained health insurance in 2022 she was able to seek care with a gynecologist.  On Nov 19, 2020 she saw Dr. Pamala Hurry for new patient visit for this pelvic mass and menorrhagia.  Pelvic exam confirmed the uterus palpated to just above the umbilicus and mild tenderness.  She was scheduled for a transvaginal ultrasound scan which was  performed on 12/07/2020 in the office and revealed a uterus measuring 15.7 x 13.2 x 11 cm.  The right ovary had some worrisome appearance with multiple septations and clusters of cysts and soft tissue nodules with internal vascularity and the ovary measured 9.1 x 8.8 x 8.9 cm.  There was a large complex mass within the endometrium measuring 10.6 x 8.8 cm.  This was felt to be either degenerating fibroid or an hematometra.  Multiple other fibroids were noted including 2 that measured greater than 4 cm and 1 that measured 2.8 cm.  An endometrial biopsy was taken at that same visit which the patient reported was benign.  She had labs drawn in the office with a resulting hemoglobin of less than 5 mg/dL.  This prompted her being sent to the emergency department where she received 2 units of packed red blood cells.  She was also prescribed iron replacement therapy upon the diagnosis of her anemia.  She could not immediately fill this prescription due to lack of finances.  CA 125 on 12/13/20 was normal at 13.   Interval Hx:  She was recommended surgery, and an MRI was performed which confirmed the above findings of a bulky fibroid uterus with an approximately 10cm adnexal mass (favor right side) in the posterior cul de sac. She received preoperative IV iron and lupron demonstrating improvement in preoperative hemoglobin (to 12.2).    Current Meds:  Outpatient Encounter Medications as of 12/13/2020  Medication Sig   ferrous sulfate 325 (65 FE) MG tablet Take 1 tablet (325 mg total) by mouth daily with breakfast.   No facility-administered encounter medications  on file as of 12/13/2020.    Allergy:  Allergies  Allergen Reactions   Bee Pollen     Congestion     Social Hx:   Social History   Socioeconomic History   Marital status: Single    Spouse name: Not on file   Number of children: Not on file   Years of education: Not on file   Highest education level: Not on file  Occupational History    Not on file  Tobacco Use   Smoking status: Every Day    Packs/day: 0.25    Years: 20.00    Pack years: 5.00    Types: Cigarettes   Smokeless tobacco: Never  Substance and Sexual Activity   Alcohol use: Yes    Alcohol/week: 1.0 standard drink    Types: 1 Standard drinks or equivalent per week   Drug use: Never   Sexual activity: Not on file  Other Topics Concern   Not on file  Social History Narrative   Not on file   Social Determinants of Health   Financial Resource Strain: Not on file  Food Insecurity: Not on file  Transportation Needs: Not on file  Physical Activity: Not on file  Stress: Not on file  Social Connections: Not on file  Intimate Partner Violence: Not on file    Past Surgical Hx:  Past Surgical History:  Procedure Laterality Date   WISDOM TOOTH EXTRACTION      Past Medical Hx:  Past Medical History:  Diagnosis Date   Anemia     Past Gynecological History:  see HPI (P0). No LMP recorded.  Family Hx:  Family History  Problem Relation Age of Onset   Breast cancer Mother    Diabetes Father     Review of Systems:  Constitutional  Feels well,   ENT Normal appearing ears and nares bilaterally Skin/Breast  No rash, sores, jaundice, itching, dryness Cardiovascular  No chest pain, shortness of breath, or edema  Pulmonary  No cough or wheeze.  Gastro Intestinal  No nausea, vomitting, or diarrhoea. No bright red blood per rectum, no abdominal pain, change in bowel movement, or constipation.  Genito Urinary  No frequency, urgency, dysuria, + pelvic pain, menorrhagia, dysmenorrhea Musculo Skeletal  No myalgia, arthralgia, joint swelling or pain  Neurologic  No weakness, numbness, change in gait,  Psychology  No depression, anxiety, insomnia.   Vitals:  Blood pressure (!) 154/97, pulse 65, temperature 97.7 F (36.5 C), temperature source Oral, resp. rate 18, height 5' 5.5" (1.664 m), weight 110 lb 0.2 oz (49.9 kg), SpO2 100 %.  Physical  Exam: WD in NAD Neck  Supple NROM, without any enlargements.  Lymph Node Survey No cervical supraclavicular or inguinal adenopathy Cardiovascular  Well perfused peripheries Lungs  No increased WOB Skin  No rash/lesions/breakdown  Psychiatry  Alert and oriented to person, place, and time  Abdomen  Normoactive bowel sounds, abdomen soft, and very thin and narrow without evidence of hernia. There is a large, mobile, mildly tender mass filling the pelvis and extending to the level of the umbilicus, right greater than left.  Back No CVA tenderness Genito Urinary  Vulva/vagina: Normal external female genitalia.  No lesions. No discharge or bleeding.  Bladder/urethra:  No lesions or masses, well supported bladder  Vagina: normal mucosa  Cervix: Tucked behind pubic symphysis. Normal appearing, no lesions.  Uterus:  bulky, extends into upper pelvis/abdomen, deviated anteriorly, lower uterine segment mobile and free of the sidewalls. No palpable lower uterine  segment or cervical fibroid.  Adnexa: unable to discretely palpate a right ovarian mass separate from the uterus. Rectal  Good tone, no masses no cul de sac nodularity.  Extremities  No bilateral cyanosis, clubbing or edema.   Thereasa Solo, MD  02/22/2021, 7:02 AM

## 2021-02-22 NOTE — Op Note (Addendum)
OPERATIVE NOTE 02/22/21  Surgeon: Donaciano Eva   Assistants: Dr Lahoma Crocker (an MD assistant was necessary for tissue manipulation, management of robotic instrumentation, retraction and positioning due to the complexity of the case and hospital policies).   Anesthesia: General endotracheal anesthesia  ASA Class: 3   Pre-operative Diagnosis:  uterine intramural fibroids, 10cm right adnexal mass (complex)  Post-operative Diagnosis:  same and benign right ovarian mass  Operation: Robotic-assisted laparoscopic total hysterectomy >250gm with bilateral salpingectomy, right oophorectomy, minilaparotomy.   Surgeon: Donaciano Eva  Assistant Surgeon: Lahoma Crocker MD  Anesthesia: GET  Urine Output: 200cc  Operative Findings:  : 20cm broad fibroid uterus with degenerating intramural fibroids, 10cm complex right adnexal mass in posterior cul de sac. 1195gm.  Estimated Blood Loss:   200cc       Total IV Fluids: 800 ml         Specimens:  uterus with cervix; right tube and ovary, left tube.          Complications:  None; patient tolerated the procedure well.         Disposition: PACU - hemodynamically stable.  Procedure Details  The patient was seen in the Holding Room. The risks, benefits, complications, treatment options, and expected outcomes were discussed with the patient.  The patient concurred with the proposed plan, giving informed consent.  The site of surgery properly noted/marked. The patient was identified as Mackenzie Bray and the procedure verified as a Robotic-assisted hysterectomy >250gm with unilateral salpingo oophorectomy. A Time Out was held and the above information confirmed.  After induction of anesthesia, the patient was draped and prepped in the usual sterile manner. Pt was placed in supine position after anesthesia and draped and prepped in the usual sterile manner. The abdominal drape was placed after the CholoraPrep had been allowed to  dry for 3 minutes.  Her arms were tucked to her side with all appropriate precautions.  The shoulders were stabilized with padded shoulder blocks applied to the acromium processes.  The patient was placed in the semi-lithotomy position in [place].  The perineum was prepped with Betadine. The patient was then prepped. Foley catheter was placed.  A sterile speculum was placed in the vagina.  The cervix was grasped with a single-tooth tenaculum and dilated with Kennon Rounds dilators.  The advincula colpotomizer uterine manipulator with a medium colpotomizer ring was placed without difficulty.  A pneum occluder balloon was placed over the manipulator.  OG tube placement was confirmed and to suction.   Next, a 5 mm skin incision was made 1 cm below the subcostal margin in the midclavicular line.  The 5 mm Optiview port and scope was used for direct entry.  Opening pressure was under 10 mm CO2.  The abdomen was insufflated and the findings were noted as above.   At this point and all points during the procedure, the patient's intra-abdominal pressure did not exceed 15 mmHg. Next, a 10 mm skin incision was made above the umbilicus and a right and left port was placed about 10 cm lateral to the robot port on the right and left side.  A fourth arm was placed in the right mid abdomen lateral to the central ports.  All ports were placed under direct visualization.  The patient was placed in steep Trendelenburg.  Bowel was folded away into the upper abdomen.  The robot was docked in the normal manner.  The hysterectomy was started after the round ligament on the right side  was incised and the retroperitoneum was entered and the pararectal space was developed.  The ureter was noted to be on the medial leaf of the broad ligament.  The peritoneum above the ureter was incised and stretched and the infundibulopelvic ligament was skeletonized, cauterized and cut.  The posterior peritoneum was taken down to the level of the KOH  ring.  The anterior peritoneum was also taken down.  The bladder flap was created to the level of the KOH ring.  The uterine artery on the right side was skeletonized, cauterized and cut in the normal manner.  A similar procedure was performed on the left, however the utero-ovarian ligament was incised to retain the normal appearing left ovary and the left tube was separately separated and sent for permanent pathology.  The colpotomy was made and the uterus, cervix, right ovary and tube were amputated but could not be delivered through the vagina due to its size.  Pedicles were inspected and excellent hemostasis was achieved.    The colpotomy at the vaginal cuff was closed with Vicryl on a CT1 needle in a running manner.  Irrigation was used and excellent hemostasis was achieved.  At this point in the procedure was completed.  Robotic instruments were removed under direct visulaization.  The robot was undocked.   A 6cm suprapubic low transverse incision was made with the scalpel. The subcutaneous skin was opened with the bovie. The fascia was opened with the bovie transversely and the rectus muscles were dissected off of the fascia inferiorally and superiorally. The peritoneum was opened sharply in the midline. The peritoneal incision was extended. The right ovary was removed from the incision, separated from the uterus and sent for pathology which revealed benign features. The uterine specimen in the endocatch bag was retrieved through the incision. The fascia was closed with running 0-vicryl. The subcutaneous fat was closed with 2-0 vicryl. 20cc of exparel mixed with 20cc of marcaine was infiltrated into the incision. The incision was closed at the skin with monocryl and dermabond.   The 10 mm ports were closed with Vicryl on a UR-5 needle and the fascia was closed with 0 Vicryl on a UR-5 needle.  The skin was closed with 4-0 Vicryl in a subcuticular manner.  Dermabond was applied.  Sponge, lap and needle  counts correct x 2.  The patient was taken to the recovery room in stable condition.  The vagina was swabbed with  minimal bleeding noted.   All instrument and needle counts were correct x  3.   The patient was transferred to the recovery room in a stable condition.  Donaciano Eva, MD

## 2021-02-22 NOTE — Anesthesia Postprocedure Evaluation (Signed)
Anesthesia Post Note  Patient: Gertie Gowda  Procedure(s) Performed: XI ROBOTIC ASSISTED TOTALHYSTERECTOMY GREATER THAN TWO HUNDRED AND FIFTY GRAMS WITH BILATERAL SALPINGECTOMY RIGHT OOPHORECTOMY, MINI LAPAROTOMY (Bilateral)     Patient location during evaluation: PACU Anesthesia Type: General Level of consciousness: awake and alert, awake and oriented Pain management: pain level controlled Vital Signs Assessment: post-procedure vital signs reviewed and stable Respiratory status: spontaneous breathing, nonlabored ventilation and respiratory function stable Cardiovascular status: blood pressure returned to baseline and stable Postop Assessment: no apparent nausea or vomiting Anesthetic complications: no   No notable events documented.  Last Vitals:  Vitals:   02/22/21 1245 02/22/21 1300  BP: (!) 157/96 (!) 136/93  Pulse: 60 61  Resp: 14 16  Temp:  37.1 C  SpO2: 99% 99%    Last Pain:  Vitals:   02/22/21 1300  TempSrc:   PainSc: [place]

## 2021-02-22 NOTE — Transfer of Care (Signed)
Immediate Anesthesia Transfer of Care Note  Patient: Mackenzie Bray  Procedure(s) Performed: XI ROBOTIC ASSISTED TOTALHYSTERECTOMY GREATER THAN TWO HUNDRED AND FIFTY GRAMS WITH BILATERAL SALPINGECTOMY RIGHT OOPHORECTOMY, MINI LAPAROTOMY (Bilateral)  Patient Location: PACU  Anesthesia Type:General  Level of Consciousness: drowsy and patient cooperative  Airway & Oxygen Therapy: Patient Spontanous Breathing and Patient connected to face mask oxygen  Post-op Assessment: Report given to RN and Post -op Vital signs reviewed and stable  Post vital signs: Reviewed and stable  Last Vitals:  Vitals Value Taken Time  BP 168/97 02/22/21 1042  Temp    Pulse 72 02/22/21 1045  Resp 17 02/22/21 1045  SpO2 100 % 02/22/21 1045  Vitals shown include unvalidated device data.  Last Pain:  Vitals:   02/22/21 0603  TempSrc:   PainSc: 0-No pain         Complications: No notable events documented.

## 2021-02-22 NOTE — Discharge Instructions (Signed)
Return to work: 4 weeks (2 weeks with physical restrictions).  Activity: 1. Be up and out of the bed during the day.  Take a nap if needed.  You may walk up steps but be careful and use the hand rail.  Stair climbing will tire you more than you think, you may need to stop part way and rest.   2. No lifting or straining for 4 weeks.  3. No driving for 1 weeks.  Do Not drive if you are taking narcotic pain medicine.  4. Shower daily.  Use soap and water on your incision and pat dry; don't rub.   5. No sexual activity and nothing in the vagina for 8 weeks.  Medications:  - Take ibuprofen and tylenol first line for pain control. Take these regularly (every 6 hours) to decrease the build up of pain.  - If necessary, for severe pain not relieved by ibuprofen, contact Dr Serita Grit office and you will be prescribed percocet.  - While taking percocet you should take sennakot every night to reduce the likelihood of constipation. If this causes diarrhea, stop its use.  Diet: 1. Low sodium Heart Healthy Diet is recommended.  2. It is safe to use a laxative if you have difficulty moving your bowels.   Wound Care: 1. Keep clean and dry.  Shower daily.  Reasons to call the Doctor:  Fever - Oral temperature greater than 100.4 degrees Fahrenheit Foul-smelling vaginal discharge Difficulty urinating Nausea and vomiting Increased pain at the site of the incision that is unrelieved with pain medicine. Difficulty breathing with or without chest pain New calf pain especially if only on one side Sudden, continuing increased vaginal bleeding with or without clots.   Follow-up: 1. See Mackenzie Bray in 4 weeks.  Contacts: For questions or concerns you should contact:  Dr. Everitt Bray at [phone] After hours and on week-ends call [phone] and ask to speak to the physician on call for Gynecologic Oncology

## 2021-02-22 NOTE — Anesthesia Preprocedure Evaluation (Addendum)
Anesthesia Evaluation  Patient identified by MRN, date of birth, ID band Patient awake    Reviewed: Allergy & Precautions, NPO status , Patient's Chart, lab work & pertinent test results  Airway Mallampati: II  TM Distance: >3 FB Neck ROM: Full    Dental  (+) Teeth Intact, Dental Advisory Given   Pulmonary Current SmokerPatient did not abstain from smoking.,    Pulmonary exam normal breath sounds clear to auscultation       Cardiovascular negative cardio ROS Normal cardiovascular exam Rhythm:Regular Rate:Normal     Neuro/Psych negative neurological ROS  negative psych ROS   GI/Hepatic negative GI ROS, Neg liver ROS,   Endo/Other  negative endocrine ROS  Renal/GU negative Renal ROS     Musculoskeletal negative musculoskeletal ROS (+)   Abdominal   Peds  Hematology negative hematology ROS (+)   Anesthesia Other Findings Day of surgery medications reviewed with the patient.  Reproductive/Obstetrics UTERINE FIBROIDS                            Anesthesia Physical Anesthesia Plan  ASA: 2  Anesthesia Plan: General   Post-op Pain Management:    Induction: Intravenous  PONV Risk Score and Plan: 3 and Midazolam, Dexamethasone and Ondansetron  Airway Management Planned: Oral ETT  Additional Equipment:   Intra-op Plan:   Post-operative Plan: Extubation in OR  Informed Consent: I have reviewed the patients History and Physical, chart, labs and discussed the procedure including the risks, benefits and alternatives for the proposed anesthesia with the patient or authorized representative who has indicated his/her understanding and acceptance.     Dental advisory given  Plan Discussed with: CRNA  Anesthesia Plan Comments: (2nd PIV)        Anesthesia Quick Evaluation

## 2021-02-23 ENCOUNTER — Encounter (HOSPITAL_COMMUNITY): Payer: Self-pay | Admitting: Gynecologic Oncology

## 2021-02-23 ENCOUNTER — Telehealth: Payer: Self-pay

## 2021-02-23 ENCOUNTER — Other Ambulatory Visit: Payer: Self-pay | Admitting: Gynecologic Oncology

## 2021-02-23 DIAGNOSIS — D259 Leiomyoma of uterus, unspecified: Secondary | ICD-10-CM

## 2021-02-23 DIAGNOSIS — N83291 Other ovarian cyst, right side: Secondary | ICD-10-CM

## 2021-02-23 MED ORDER — TRAMADOL HCL 50 MG PO TABS: 50.0000 mg | ORAL_TABLET | Freq: Four times a day (QID) | ORAL | 0 refills | Status: DC | PRN

## 2021-02-23 MED ORDER — IBUPROFEN 800 MG PO TABS: 800.0000 mg | ORAL_TABLET | Freq: Three times a day (TID) | ORAL | 0 refills | Status: DC | PRN

## 2021-02-23 MED ORDER — SENNOSIDES-DOCUSATE SODIUM 8.6-50 MG PO TABS: 2.0000 | ORAL_TABLET | Freq: Every day | ORAL | 0 refills | Status: DC

## 2021-02-23 NOTE — Telephone Encounter (Signed)
Spoke with Mackenzie Bray this morning. She states she is eating, drinking and urinating well. She has not had a BM yet but feels like she needs too. She denies fever or chills. Incisions are dry and intact. Instructed patient to leave honeycomb dressing in place until 5 days after her surgery. Patient reports not picking up her prescriptions. She thought they were called into [facility] and were told they weren't sent there. Confirmed her prescriptions were sent to [facility]. Per patient request prescriptions will be resent to [facility] and cancelled at [facility]. Patient reports her pain as tolerable but would like pain medication. Reviewed with her alternating ibuprofen and tylenol for pain management and using tramadol as needed. Instructed to take senokot at night and to drink plenty of fluids to avoid constipation. Patient verbalized understanding.   Instructed to call office with any fever, chills, purulent drainage, uncontrolled pain or any other questions or concerns. Patient verbalizes understanding.   Pt aware of post op appointments as well as the office number [phone] and after hours number [phone] to call if she has any questions or concerns

## 2021-02-23 NOTE — Progress Notes (Signed)
Post op meds sent to a new pharmacy per pt request.

## 2021-02-24 LAB — CYTOLOGY - NON PAP

## 2021-02-25 LAB — SURGICAL PATHOLOGY

## 2021-02-26 LAB — TYPE AND SCREEN
ABO/RH(D): O POS
Antibody Screen: POSITIVE
DAT, IgG: POSITIVE
Unit division: 0
Unit division: 0

## 2021-02-26 LAB — BPAM RBC
Blood Product Expiration Date: 202209292359
Blood Product Expiration Date: 202209292359
Unit Type and Rh: 5100
Unit Type and Rh: 5100

## 2021-03-15 ENCOUNTER — Encounter: Payer: Self-pay | Admitting: Gynecologic Oncology

## 2021-03-15 NOTE — Progress Notes (Signed)
Follow-up Note: Gyn-Onc  Consult was requested by Dr. Pamala Hurry for the evaluation of Mackenzie Bray 45 y.o. female  CC:  Chief Complaint  Patient presents with   Pelvic mass in female    Assessment/Plan:  [name]  is a 45 y.o.  year old with a history of a bulky fibroid uterus (16cm) and a right adnexal mass who is s/p robotic assisted total hysterectomy for a uterus >250gm, RSO.  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  The patient strongly desired (normal) ovarian preservation at the time of surgery. I am recommending annual ultrasound of the remaining left ovary and CA 125 assessment for her history of LMP. She will follow-up with Dr Pamala Hurry for this.    HPI: [name] is a 45 year old P0 who was seen in consultation at the request of Dr Pamala Hurry for evaluation of  a complex right ovarian mass and symptomatic fibroid uterus.  The patient has had menorrhagia and dysmenorrhea symptoms since menarche.  For several preceding years prior to her presentation she was developing right-sided pelvic pain and growth in the mass in her lower abdomen.  She did not seek care for this as she lacked health insurance.  There was no acute change to either her menorrhagia or mask symptoms however when she gained health insurance in 2022 she was able to seek care with a gynecologist.  On Nov 19, 2020 she saw Dr. Pamala Hurry for new patient visit for this pelvic mass and menorrhagia.  Pelvic exam confirmed the uterus palpated to just above the umbilicus and mild tenderness.  She was scheduled for a transvaginal ultrasound scan which was performed on 12/07/2020 in the office and revealed a uterus measuring 15.7 x 13.2 x 11 cm.  The right ovary had some worrisome appearance with multiple septations and clusters of cysts and soft tissue nodules with internal vascularity and the ovary measured 9.1 x 8.8 x 8.9 cm.  There was a large  complex mass within the endometrium measuring 10.6 x 8.8 cm.  This was felt to be either degenerating fibroid or an hematometra.  Multiple other fibroids were noted including 2 that measured greater than 4 cm and 1 that measured 2.8 cm.  An endometrial biopsy was taken at that same visit which the patient reported was benign.  She had labs drawn in the office with a resulting hemoglobin of less than 5 mg/dL.  This prompted her being sent to the emergency department where she received 2 units of packed red blood cells.  She was also prescribed iron replacement therapy upon the diagnosis of her anemia.  She could not immediately fill this prescription due to lack of finances.  Interval Hx:  On 02/22/21 she underwent robotic assisted total hysterectomy >250gm, RSO, minilaparotomy. Intraoperative findings were significant for a 20cm fibroid uterus and 10cm right ovarian complex mass (LMP on frozen). Surgery was uncomplicated. The left ovary was retained at the request of the patient and it was grossly normal.  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  Since surgery she has done well with no compliants.   Current Meds:  Outpatient Encounter Medications as of 03/16/2021  Medication Sig   ferrous sulfate 325 (65 FE) MG tablet Take 1 tablet (325 mg total) by mouth daily with breakfast.   folic acid (FOLVITE) 1 MG tablet Take 1 mg by mouth daily.   senna-docusate (  SENOKOT-S) 8.6-50 MG tablet Take 2 tablets by mouth at bedtime. For AFTER surgery, do not take if having diarrhea   docusate sodium (COLACE) 100 MG capsule Take 100 mg by mouth 2 (two) times daily. (Patient not taking: Reported on 03/15/2021)   ibuprofen (ADVIL) 800 MG tablet Take 1 tablet (800 mg total) by mouth every 8 (eight) hours as needed for moderate pain. For AFTER surgery only (Patient not taking: Reported on 03/15/2021)   traMADol (ULTRAM) 50 MG tablet Take 1 tablet (50 mg  total) by mouth every 6 (six) hours as needed for severe pain. For AFTER surgery only, do not take and drive (Patient not taking: Reported on 03/15/2021)   No facility-administered encounter medications on file as of 03/16/2021.    Allergy:  Allergies  Allergen Reactions   Bee Pollen     Congestion     Social Hx:   Social History   Socioeconomic History   Marital status: Single    Spouse name: Not on file   Number of children: Not on file   Years of education: Not on file   Highest education level: Not on file  Occupational History   Not on file  Tobacco Use   Smoking status: Every Day    Packs/day: 0.25    Years: 20.00    Pack years: 5.00    Types: Cigarettes   Smokeless tobacco: Never  Substance and Sexual Activity   Alcohol use: Yes    Alcohol/week: 1.0 standard drink    Types: 1 Standard drinks or equivalent per week   Drug use: Never   Sexual activity: Not on file  Other Topics Concern   Not on file  Social History Narrative   Not on file   Social Determinants of Health   Financial Resource Strain: Not on file  Food Insecurity: Not on file  Transportation Needs: Not on file  Physical Activity: Not on file  Stress: Not on file  Social Connections: Not on file  Intimate Partner Violence: Not on file    Past Surgical Hx:  Past Surgical History:  Procedure Laterality Date   ROBOTIC ASSISTED TOTAL HYSTERECTOMY WITH BILATERAL SALPINGO OOPHERECTOMY Bilateral 02/22/2021   Procedure: XI ROBOTIC ASSISTED TOTALHYSTERECTOMY GREATER THAN TWO HUNDRED AND FIFTY GRAMS WITH BILATERAL SALPINGECTOMY RIGHT OOPHORECTOMY, MINI LAPAROTOMY;  Surgeon: Everitt Amber, MD;  Location: [facility];  Service: Gynecology;  Laterality: Bilateral;   WISDOM TOOTH EXTRACTION      Past Medical Hx:  Past Medical History:  Diagnosis Date   Anemia     Past Gynecological History:  see HPI (P0). No LMP recorded.  Family Hx:  Family History  Problem Relation Age of Onset   Breast cancer Mother     Diabetes Father     Review of Systems:  Constitutional  Feels well,   ENT Normal appearing ears and nares bilaterally Skin/Breast  No rash, sores, jaundice, itching, dryness Cardiovascular  No chest pain, shortness of breath, or edema  Pulmonary  No cough or wheeze.  Gastro Intestinal  No nausea, vomitting, or diarrhoea. No bright red blood per rectum, no abdominal pain, change in bowel movement, or constipation.  Genito Urinary  No frequency, urgency, dysuria, no bleeding Musculo Skeletal  No myalgia, arthralgia, joint swelling or pain  Neurologic  No weakness, numbness, change in gait,  Psychology  No depression, anxiety, insomnia.   Vitals:  Blood pressure (!) 122/91, pulse 77, temperature 98 F (36.7 C), temperature source Oral, resp. rate 16, height 5\' 5"  (  1.651 m), weight 107 lb 3.2 oz (48.6 kg), SpO2 100 %.  Physical Exam: WD in NAD Neck  Supple NROM, without any enlargements.  Lymph Node Survey No cervical supraclavicular or inguinal adenopathy Cardiovascular  Well perfused peripheries Lungs  No increased WOB Skin  No rash/lesions/breakdown  Psychiatry  Alert and oriented to person, place, and time  Abdomen  Normoactive bowel sounds, abdomen soft, and very thin and narrow without evidence of hernia. Incisions soft, well healed Back No CVA tenderness Genito Urinary  Normal vaginal cuff, in tact, no bleeding. Rectal  deferred Extremities  No bilateral cyanosis, clubbing or edema.    Thereasa Solo, MD  03/16/2021, 2:08 PM

## 2021-03-16 ENCOUNTER — Other Ambulatory Visit: Payer: Self-pay

## 2021-03-16 ENCOUNTER — Inpatient Hospital Stay: Payer: BC Managed Care – PPO | Attending: Gynecologic Oncology | Admitting: Gynecologic Oncology

## 2021-03-16 ENCOUNTER — Encounter: Payer: Self-pay | Admitting: Gynecologic Oncology

## 2021-03-16 VITALS — BP 122/91 | HR 77 | Temp 98.0°F | Resp 16 | Ht 65.0 in | Wt 107.2 lb

## 2021-03-16 DIAGNOSIS — D3911 Neoplasm of uncertain behavior of right ovary: Secondary | ICD-10-CM

## 2021-03-16 DIAGNOSIS — Z90710 Acquired absence of both cervix and uterus: Secondary | ICD-10-CM | POA: Diagnosis not present

## 2021-03-16 DIAGNOSIS — R1900 Intra-abdominal and pelvic swelling, mass and lump, unspecified site: Secondary | ICD-10-CM

## 2021-03-16 DIAGNOSIS — Z90721 Acquired absence of ovaries, unilateral: Secondary | ICD-10-CM | POA: Insufficient documentation

## 2021-03-16 DIAGNOSIS — D259 Leiomyoma of uterus, unspecified: Secondary | ICD-10-CM | POA: Insufficient documentation

## 2021-03-16 NOTE — Patient Instructions (Signed)
Your surgery showed that the right ovary showed a borderline tumor.  Dr Denman George recommends that you see Dr Pamala Hurry once a year for a pelvic ultrasound of the left ovary.

## 2021-03-22 ENCOUNTER — Telehealth: Payer: Self-pay

## 2021-03-22 ENCOUNTER — Other Ambulatory Visit: Payer: Self-pay | Admitting: Gynecologic Oncology

## 2021-03-22 DIAGNOSIS — N83291 Other ovarian cyst, right side: Secondary | ICD-10-CM

## 2021-03-22 DIAGNOSIS — D259 Leiomyoma of uterus, unspecified: Secondary | ICD-10-CM

## 2021-03-22 NOTE — Telephone Encounter (Signed)
Left message requesting return call regarding prescription refill.

## 2021-04-01 ENCOUNTER — Encounter: Payer: Self-pay | Admitting: Gynecologic Oncology

## 2021-04-12 ENCOUNTER — Other Ambulatory Visit: Payer: Self-pay

## 2021-04-12 ENCOUNTER — Ambulatory Visit
Admission: EM | Admit: 2021-04-12 | Discharge: 2021-04-12 | Disposition: A | Discharge status: Nursing Home (Includes ICF) | Payer: BC Managed Care – PPO

## 2021-04-12 ENCOUNTER — Encounter: Payer: Self-pay | Admitting: Emergency Medicine

## 2021-04-12 ENCOUNTER — Emergency Department (HOSPITAL_COMMUNITY)
Admission: EM | Admit: 2021-04-12 | Discharge: 2021-04-12 | Disposition: A | Discharge status: Home / Self Care | Payer: BC Managed Care – PPO | Attending: Emergency Medicine | Admitting: Emergency Medicine

## 2021-04-12 ENCOUNTER — Emergency Department (HOSPITAL_COMMUNITY): Payer: BC Managed Care – PPO

## 2021-04-12 ENCOUNTER — Encounter (HOSPITAL_COMMUNITY): Payer: Self-pay

## 2021-04-12 DIAGNOSIS — L600 Ingrowing nail: Secondary | ICD-10-CM | POA: Diagnosis not present

## 2021-04-12 DIAGNOSIS — F17210 Nicotine dependence, cigarettes, uncomplicated: Secondary | ICD-10-CM | POA: Insufficient documentation

## 2021-04-12 DIAGNOSIS — M79672 Pain in left foot: Secondary | ICD-10-CM | POA: Diagnosis not present

## 2021-04-12 DIAGNOSIS — L03032 Cellulitis of left toe: Secondary | ICD-10-CM | POA: Diagnosis not present

## 2021-04-12 LAB — BASIC METABOLIC PANEL
Anion gap: 5 (ref 5–15)
BUN: 15 mg/dL (ref 6–20)
CO2: 26 mmol/L (ref 22–32)
Calcium: 9.2 mg/dL (ref 8.9–10.3)
Chloride: 108 mmol/L (ref 98–111)
Creatinine, Ser: 0.76 mg/dL (ref 0.44–1.00)
GFR, Estimated: >60 mL/min (ref >60)
Glucose, Bld: 71 mg/dL (ref 70–99)
Potassium: 3.9 mmol/L (ref 3.5–5.1)
Sodium: 139 mmol/L (ref 135–145)

## 2021-04-12 LAB — CBC WITH DIFFERENTIAL/PLATELET
Abs Immature Granulocytes: 0 10*3/uL (ref 0.00–0.07)
Basophils Absolute: 0.1 10*3/uL (ref 0.0–0.1)
Basophils Relative: 1 %
Eosinophils Absolute: 0.1 10*3/uL (ref 0.0–0.5)
Eosinophils Relative: 2 %
HCT: 43.1 % (ref 36.0–46.0)
Hemoglobin: 13 g/dL (ref 12.0–15.0)
Immature Granulocytes: 0 %
Lymphocytes Relative: 32 %
Lymphs Abs: 1.6 10*3/uL (ref 0.7–4.0)
MCH: 20.7 pg — ABNORMAL LOW (ref 26.0–34.0)
MCHC: 30.2 g/dL (ref 30.0–36.0)
MCV: 68.5 fL — ABNORMAL LOW (ref 80.0–100.0)
Monocytes Absolute: 0.3 10*3/uL (ref 0.1–1.0)
Monocytes Relative: 6 %
Neutro Abs: 2.8 10*3/uL (ref 1.7–7.7)
Neutrophils Relative %: 59 %
Platelets: 170 10*3/uL (ref 150–400)
RBC: 6.29 MIL/uL — ABNORMAL HIGH (ref 3.87–5.11)
RDW: 23.6 % — ABNORMAL HIGH (ref 11.5–15.5)
WBC: 4.8 10*3/uL (ref 4.0–10.5)
nRBC: 0 % (ref 0.0–0.2)

## 2021-04-12 MED ORDER — LIDOCAINE 5 % EX OINT
TOPICAL_OINTMENT | Freq: Once | CUTANEOUS | Status: AC
  Administered 2021-04-12: 1 via TOPICAL
  Filled 2021-04-12: qty 35.44

## 2021-04-12 NOTE — Discharge Instructions (Addendum)
Please go to the emergency department as soon as you leave urgent care for further evaluation and management. ?

## 2021-04-12 NOTE — ED Provider Notes (Signed)
Emergency Medicine Provider Triage Evaluation Note  Mackenzie Bray , a 45 y.o. female  was evaluated in triage.  Pt complains of left foot pain that began a few days ago.  She was seen evaluated urgent care for the symptoms and was sent here for worsening infection and possible necrosis of the webspace between the left fourth and fifth toes.  She has been treated with omeprazole and cephalexin with little improvement.  She rates her pain severe in severity.  She denies any fever or chills.  Review of Systems  Positive:  Negative: See above   Physical Exam  BP (!) 153/94 (BP Location: Left Arm)   Pulse 92   Temp 98.8 F (37.1 C) (Oral)   Resp 17   Ht 5' 5.5" (1.664 m)   Wt 50.8 kg   LMP 12/29/2020   SpO2 98%   BMI 18.35 kg/m  Gen:   Awake, no distress   Resp:  Normal effort  MSK:   Moves extremities without difficulty  Other:  Discoloration of the left great and second toe.  There is also skin breakdown and ulceration on the webspace between the fourth and fifth toe.  Medical Decision Making  Medically screening exam initiated at 4:45 PM.  Appropriate orders placed.  Gertie Gowda was informed that the remainder of the evaluation will be completed by another provider, this initial triage assessment does not replace that evaluation, and the importance of remaining in the ED until their evaluation is complete.  Concerning for onychomycosis of the toenails.   Myna Bright Charco, PA-C 04/12/21 1647    Hayden Rasmussen, MD 04/13/21 1246

## 2021-04-12 NOTE — ED Triage Notes (Signed)
Patient c/o cark discoloration between the left 4th and 5th toe.  Patient has redness around the the big toe and 2nd toes. Nail beds are discolored. Patient was seen at an UC today and was sent to the Ed for further evaluation. Patient was also prescribed clotrimazole/betamethasone cream and cephalexin and states no improvement in her toes.

## 2021-04-12 NOTE — ED Provider Notes (Signed)
[name] Provider Note   CSN: [id number] Arrival date & time: 04/12/21  1609     History Chief Complaint  Patient presents with   Toe Pain    Mackenzie Bray is a 45 y.o. female.  HPI    45 year old female comes in with chief complaint of toe pain.  Patient was sent here by the urgent care for evaluation of her toe.  Patient reports that over the last few weeks she has had increased pain in her left foot.  Her toes are discolored, she also has area of ulceration between her fourth and fifth toe that is becoming more painful.  She has set up an appointment with podiatry and is set to see them tomorrow.  Patient denies any nausea, vomiting, fevers, chills.  She denies any drug use, heavy alcohol use.  She works at [employer].  She denies any recent trauma and denies any prolonged exposure to wet elements.  The other foot has no issues.  Patient had a telehealth visit last week.  She is already taking Keflex and Lotrimin with hydrochlorothiazide ointment.  Past Medical History:  Diagnosis Date   Anemia     Patient Active Problem List   Diagnosis Date Noted   Uterine fibroid 12/13/2020   Iron deficiency anemia due to chronic blood loss 12/13/2020   Pelvic mass in female 12/13/2020   Complex cyst of right ovary 12/13/2020   Intramural, submucous, and subserous leiomyoma of uterus 12/13/2020    Past Surgical History:  Procedure Laterality Date   ROBOTIC ASSISTED TOTAL HYSTERECTOMY WITH BILATERAL SALPINGO OOPHERECTOMY Bilateral 02/22/2021   Procedure: XI ROBOTIC ASSISTED TOTALHYSTERECTOMY GREATER THAN TWO HUNDRED AND FIFTY GRAMS WITH BILATERAL SALPINGECTOMY RIGHT OOPHORECTOMY, MINI LAPAROTOMY;  Surgeon: Everitt Amber, MD;  Location: [facility];  Service: Gynecology;  Laterality: Bilateral;   WISDOM TOOTH EXTRACTION       OB History   No obstetric history on file.     Family History  Problem Relation Age of Onset   Breast cancer Mother     Diabetes Father     Social History   Tobacco Use   Smoking status: Every Day    Packs/day: 0.25    Years: 20.00    Pack years: 5.00    Types: Cigarettes   Smokeless tobacco: Never  Vaping Use   Vaping Use: Never used  Substance Use Topics   Alcohol use: Yes    Alcohol/week: 1.0 standard drink    Types: 1 Standard drinks or equivalent per week   Drug use: Never    Home Medications Prior to Admission medications   Medication Sig Start Date End Date Taking? Authorizing Provider  docusate sodium (COLACE) 100 MG capsule Take 100 mg by mouth 2 (two) times daily. Patient not taking: Reported on 03/15/2021    [provider]  ferrous sulfate 325 (65 FE) MG tablet Take 1 tablet (325 mg total) by mouth daily with breakfast. 12/08/20   Rayna Sexton, PA-C  folic acid (FOLVITE) 1 MG tablet Take 1 mg by mouth daily. 12/14/20   [provider]  ibuprofen (ADVIL) 800 MG tablet PLEASE SEE ATTACHED FOR DETAILED DIRECTIONS 03/22/21   Cross, Lenna Sciara D, NP  senna-docusate (SENOKOT-S) 8.6-50 MG tablet Take 2 tablets by mouth at bedtime. For AFTER surgery, do not take if having diarrhea 02/23/21   Joylene John D, NP  traMADol (ULTRAM) 50 MG tablet Take 1 tablet (50 mg total) by mouth every 6 (six) hours as needed for  severe pain. For AFTER surgery only, do not take and drive Patient not taking: Reported on 03/15/2021 02/23/21   Joylene John D, NP    Allergies    Bee pollen  Review of Systems   Review of Systems  Constitutional:  Positive for activity change.  Respiratory:  Negative for shortness of breath.   Cardiovascular:  Negative for chest pain.  Skin:  Positive for rash.  Hematological:  Does not bruise/bleed easily.  All other systems reviewed and are negative.  Physical Exam Updated Vital Signs BP (!) 140/100 (BP Location: Right Arm)   Pulse 74   Temp 98.7 F (37.1 C) (Oral)   Resp 16   Ht 5' 5.5" (1.664 m)   Wt 50.8 kg   LMP 12/29/2020   SpO2 100%   BMI  18.35 kg/m   Physical Exam Vitals and nursing note reviewed.  Constitutional:      Appearance: She is well-developed.  HENT:     Head: Atraumatic.  Cardiovascular:     Rate and Rhythm: Normal rate.  Pulmonary:     Effort: Pulmonary effort is normal.  Musculoskeletal:     Cervical back: Normal range of motion and neck supple.     Comments: Nails in the left foot toe #1 and 2 are raised and yellow in color.  There is surrounding mild erythema without any tenderness to palpation or fluctuance.  Between the toe 4 and 5 there is ulceration of the skin with hyperpigmentation.  Intact dorsalis pedis  Skin:    General: Skin is warm and dry.  Neurological:     Mental Status: She is alert and oriented to person, place, and time.           ED Results / Procedures / Treatments   Labs (all labs ordered are listed, but only abnormal results are displayed) Labs Reviewed  CBC WITH DIFFERENTIAL/PLATELET - Abnormal; Notable for the following components:      Result Value   RBC 6.29 (*)    MCV 68.5 (*)    MCH 20.7 (*)    RDW 23.6 (*)    All other components within normal limits  BASIC METABOLIC PANEL    EKG None  Radiology DG Foot Complete Left  Result Date: 04/12/2021 CLINICAL DATA:  Left foot ulcer, question osteomyelitis.  Pain. EXAM: LEFT FOOT - COMPLETE 3+ VIEW COMPARISON:  None. FINDINGS: There is no evidence of fracture or dislocation. There is no evidence of arthropathy or other focal bone abnormality. Soft tissues are unremarkable. No bone destruction to suggest osteomyelitis. IMPRESSION: Negative. Electronically Signed   By: Rolm Baptise M.D.   On: 04/12/2021 17:22    Procedures Procedures   Medications Ordered in ED Medications  lidocaine (XYLOCAINE) 5 % ointment (1 application Topical Given 04/12/21 2220)    ED Course  I have reviewed the triage vital signs and the nursing notes.  Pertinent labs & imaging results that were available during my care of the  patient were reviewed by me and considered in my medical decision making (see chart for details).    MDM Rules/Calculators/A&P                           45 year old comes in with chief complaint of toenail issues.  Sent here from urgent care.  She has an appointment with podiatry tomorrow.  I do not think anything acute is needed.  She is already on Keflex and is also taking Lotrimin  ointment.  She has a follow-up with podiatry tomorrow.  She is neurovascularly intact.  Stable for discharge.  She requested that she place week between toe 4 and 5 and clean any debris between them twice a day.   Final Clinical Impression(s) / ED Diagnoses Final diagnoses:  Ingrown right big toenail  Cellulitis of toe of left foot    Rx / DC Orders ED Discharge Orders          Ordered    Post-op shoe        04/12/21 2201             Varney Biles, MD 04/12/21 2344

## 2021-04-12 NOTE — ED Provider Notes (Signed)
[facility]    CSN: [id number] Arrival date & time: 04/12/21  2683      History   Chief Complaint Chief Complaint  Patient presents with   Foot Injury    HPI Mackenzie Bray is a 45 y.o. female.   Patient presents with left foot pain that started approximately 2 weeks ago.  Patient reports that the majority the pain is in the lateral portion of the left foot and in between the fourth and fifth digits of toes.  Patient reports pain bearing weight.  Denies any injury to the foot.  Patient does report history of foot ulcers and fungal infection of the feet.  Was seen by televisit 5 days ago and was prescribed clotrimazole and betamethasone cream as well as cephalexin antibiotic.  Patient reports no improvement in symptoms with medications and thinks that the symptoms are worsening.  Denies any numbness or tingling in the foot.  Pain is starting to radiate to the dorsal and plantar surfaces of the foot.  Patient denies any fevers.   Foot Injury  Past Medical History:  Diagnosis Date   Anemia     Patient Active Problem List   Diagnosis Date Noted   Uterine fibroid 12/13/2020   Iron deficiency anemia due to chronic blood loss 12/13/2020   Pelvic mass in female 12/13/2020   Complex cyst of right ovary 12/13/2020   Intramural, submucous, and subserous leiomyoma of uterus 12/13/2020    Past Surgical History:  Procedure Laterality Date   ROBOTIC ASSISTED TOTAL HYSTERECTOMY WITH BILATERAL SALPINGO OOPHERECTOMY Bilateral 02/22/2021   Procedure: XI ROBOTIC ASSISTED TOTALHYSTERECTOMY GREATER THAN TWO HUNDRED AND FIFTY GRAMS WITH BILATERAL SALPINGECTOMY RIGHT OOPHORECTOMY, MINI LAPAROTOMY;  Surgeon: Everitt Amber, MD;  Location: [facility];  Service: Gynecology;  Laterality: Bilateral;   WISDOM TOOTH EXTRACTION      OB History   No obstetric history on file.      Home Medications    Prior to Admission medications   Medication Sig Start Date End Date Taking?  Authorizing Provider  docusate sodium (COLACE) 100 MG capsule Take 100 mg by mouth 2 (two) times daily. Patient not taking: Reported on 03/15/2021    [provider]  ferrous sulfate 325 (65 FE) MG tablet Take 1 tablet (325 mg total) by mouth daily with breakfast. 12/08/20   Rayna Sexton, PA-C  folic acid (FOLVITE) 1 MG tablet Take 1 mg by mouth daily. 12/14/20   [provider]  ibuprofen (ADVIL) 800 MG tablet PLEASE SEE ATTACHED FOR DETAILED DIRECTIONS 03/22/21   Cross, Lenna Sciara D, NP  senna-docusate (SENOKOT-S) 8.6-50 MG tablet Take 2 tablets by mouth at bedtime. For AFTER surgery, do not take if having diarrhea 02/23/21   Joylene John D, NP  traMADol (ULTRAM) 50 MG tablet Take 1 tablet (50 mg total) by mouth every 6 (six) hours as needed for severe pain. For AFTER surgery only, do not take and drive Patient not taking: Reported on 03/15/2021 02/23/21   Dorothyann Gibbs, NP    Family History Family History  Problem Relation Age of Onset   Breast cancer Mother    Diabetes Father     Social History Social History   Tobacco Use   Smoking status: Every Day    Packs/day: 0.25    Years: 20.00    Pack years: 5.00    Types: Cigarettes   Smokeless tobacco: Never  Substance Use Topics   Alcohol use: Yes    Alcohol/week: 1.0 standard drink  Types: 1 Standard drinks or equivalent per week   Drug use: Never     Allergies   Bee pollen   Review of Systems Review of Systems Per HPI  Physical Exam Triage Vital Signs ED Triage Vitals [04/12/21 0907]  Enc Vitals Group     BP (!) 149/96     Pulse Rate 69     Resp 16     Temp 97.7 F (36.5 C)     Temp Source Oral     SpO2 98 %     Weight      Height      Head Circumference      Peak Flow      Pain Score 9     Pain Loc      Pain Edu?      Excl. in [place]?    No data found.  Updated Vital Signs BP (!) 149/96 (BP Location: Left Arm)   Pulse 69   Temp 97.7 F (36.5 C) (Oral)   Resp 16   LMP 12/29/2020    SpO2 98%   Visual Acuity Right Eye Distance:   Left Eye Distance:   Bilateral Distance:    Right Eye Near:   Left Eye Near:    Bilateral Near:     Physical Exam Constitutional:      General: She is not in acute distress.    Appearance: Normal appearance. She is not toxic-appearing or diaphoretic.  HENT:     Head: Normocephalic and atraumatic.  Eyes:     Extraocular Movements: Extraocular movements intact.     Conjunctiva/sclera: Conjunctivae normal.  Pulmonary:     Effort: Pulmonary effort is normal.  Musculoskeletal:       Feet:  Feet:     Left foot:     Skin integrity: Ulcer, skin breakdown and erythema present.     Toenail Condition: Left toenails are abnormally thick. Fungal disease present.    Comments: Patient has erythema noted around nail of left great toe.  Thickening and yellow discoloration throughout entirety of toenails to left toe.  Patient has splitting of skin in between fourth and fifth digits with black discoloration of skin surrounding.  Ulcerated area present to circled area on diagram that is approximately 2 cm x 2 cm in diameter.  No active purulent drainage from any of the sores or lesions.  Capillary refill greater than 3 seconds. Neurological:     General: No focal deficit present.     Mental Status: She is alert and oriented to person, place, and time. Mental status is at baseline.  Psychiatric:        Mood and Affect: Mood normal.        Behavior: Behavior normal.        Thought Content: Thought content normal.        Judgment: Judgment normal.     UC Treatments / Results  Labs (all labs ordered are listed, but only abnormal results are displayed) Labs Reviewed - No data to display  EKG   Radiology No results found.  Procedures Procedures (including critical care time)  Medications Ordered in UC Medications - No data to display  Initial Impression / Assessment and Plan / UC Course  I have reviewed the triage vital signs and the  nursing notes.  Pertinent labs & imaging results that were available during my care of the patient were reviewed by me and considered in my medical decision making (see chart for details).  It appears the patient has fungal infection of the feet that is not improving with treatment.  Also has ulcer to lateral surface of the foot that is not yet open, although will require treatment.  She also has black discoloration of sore in between fourth and fifth digits that could indicate necrosis.  It is concerning that patient's capillary refill is greater than 3 seconds.  Patient will need immediate evaluation at a higher level of care due to capillary refill being prolonged.  Advised patient to go to the hospital for further evaluation and management.  Patient was agreeable with plan.  Vital signs stable at discharge.  Agree with patient self transport to the hospital. Final Clinical Impressions(s) / UC Diagnoses   Final diagnoses:  Left foot pain     Discharge Instructions      Please go to the emergency department as soon as you leave urgent care for further evaluation and management.     ED Prescriptions   None    PDMP not reviewed this encounter.   Odis Luster, [place] 04/12/21 [phone]

## 2021-04-12 NOTE — ED Triage Notes (Signed)
2 weeks prior began having a lot of pain between fourth and fifth toes. Visible darkening of skin with what look like painful fissures forming. Also discoloration/darkening present in the great toe around the nail bed. Sensation present in all toes, denies hx of diabetes. Also reports calf swelling intermittently in same leg. Was prescribed clotrimazole/betamethasone cream and cephalexin on Friday, has been taking without improvement.

## 2021-04-12 NOTE — ED Notes (Signed)
Patient given crutches and educated on how to use them with return demonstration.

## 2021-05-26 ENCOUNTER — Other Ambulatory Visit: Payer: Self-pay

## 2021-05-26 ENCOUNTER — Ambulatory Visit
Admission: EM | Admit: 2021-05-26 | Discharge: 2021-05-26 | Disposition: A | Discharge status: Home / Self Care | Payer: BC Managed Care – PPO | Attending: Physician Assistant | Admitting: Physician Assistant

## 2021-05-26 DIAGNOSIS — Z20822 Contact with and (suspected) exposure to covid-19: Secondary | ICD-10-CM

## 2021-05-26 DIAGNOSIS — R112 Nausea with vomiting, unspecified: Secondary | ICD-10-CM

## 2021-05-26 DIAGNOSIS — J069 Acute upper respiratory infection, unspecified: Secondary | ICD-10-CM | POA: Diagnosis not present

## 2021-05-26 MED ORDER — ONDANSETRON 4 MG PO TBDP: 4.0000 mg | ORAL_TABLET | Freq: Three times a day (TID) | ORAL | 0 refills | Status: DC | PRN

## 2021-05-26 NOTE — ED Provider Notes (Signed)
[facility]    CSN: [id number] Arrival date & time: 05/26/21  1540      History   Chief Complaint Chief Complaint  Patient presents with   Cough    HPI Mackenzie Bray is a 45 y.o. female.   Patient here today for evaluation of nausea, vomiting, headache, nasal congestion that started 3 days ago. She has tried tylenol without significant relief. She has not had known fever.   The history is provided by the patient.  Cough Associated symptoms: sore throat   Associated symptoms: no chills, no ear pain, no eye discharge, no fever, no shortness of breath and no wheezing    Past Medical History:  Diagnosis Date   Anemia     Patient Active Problem List   Diagnosis Date Noted   Uterine fibroid 12/13/2020   Iron deficiency anemia due to chronic blood loss 12/13/2020   Pelvic mass in female 12/13/2020   Complex cyst of right ovary 12/13/2020   Intramural, submucous, and subserous leiomyoma of uterus 12/13/2020    Past Surgical History:  Procedure Laterality Date   ROBOTIC ASSISTED TOTAL HYSTERECTOMY WITH BILATERAL SALPINGO OOPHERECTOMY Bilateral 02/22/2021   Procedure: XI ROBOTIC ASSISTED TOTALHYSTERECTOMY GREATER THAN TWO HUNDRED AND FIFTY GRAMS WITH BILATERAL SALPINGECTOMY RIGHT OOPHORECTOMY, MINI LAPAROTOMY;  Surgeon: Everitt Amber, MD;  Location: [facility];  Service: Gynecology;  Laterality: Bilateral;   WISDOM TOOTH EXTRACTION      OB History   No obstetric history on file.      Home Medications    Prior to Admission medications   Medication Sig Start Date End Date Taking? Authorizing Provider  ondansetron (ZOFRAN-ODT) 4 MG disintegrating tablet Take 1 tablet (4 mg total) by mouth every 8 (eight) hours as needed. 05/26/21  Yes Francene Finders, PA-C  docusate sodium (COLACE) 100 MG capsule Take 100 mg by mouth 2 (two) times daily. Patient not taking: Reported on 03/15/2021    [provider]  ferrous sulfate 325 (65 FE) MG tablet Take 1  tablet (325 mg total) by mouth daily with breakfast. 12/08/20   Rayna Sexton, PA-C  folic acid (FOLVITE) 1 MG tablet Take 1 mg by mouth daily. 12/14/20   [provider]  ibuprofen (ADVIL) 800 MG tablet PLEASE SEE ATTACHED FOR DETAILED DIRECTIONS 03/22/21   Cross, Lenna Sciara D, NP  senna-docusate (SENOKOT-S) 8.6-50 MG tablet Take 2 tablets by mouth at bedtime. For AFTER surgery, do not take if having diarrhea 02/23/21   Joylene John D, NP  traMADol (ULTRAM) 50 MG tablet Take 1 tablet (50 mg total) by mouth every 6 (six) hours as needed for severe pain. For AFTER surgery only, do not take and drive Patient not taking: Reported on 03/15/2021 02/23/21   Dorothyann Gibbs, NP    Family History Family History  Problem Relation Age of Onset   Breast cancer Mother    Diabetes Father     Social History Social History   Tobacco Use   Smoking status: Every Day    Packs/day: 0.25    Years: 20.00    Pack years: 5.00    Types: Cigarettes   Smokeless tobacco: Never  Vaping Use   Vaping Use: Never used  Substance Use Topics   Alcohol use: Yes    Alcohol/week: 1.0 standard drink    Types: 1 Standard drinks or equivalent per week   Drug use: Never     Allergies   Bee pollen   Review of Systems Review of Systems  Constitutional:  Negative for chills and fever.  HENT:  Positive for congestion, sinus pressure and sore throat. Negative for ear pain.   Eyes:  Negative for discharge and redness.  Respiratory:  Positive for cough. Negative for shortness of breath and wheezing.   Gastrointestinal:  Positive for nausea and vomiting. Negative for abdominal pain and diarrhea.    Physical Exam Triage Vital Signs ED Triage Vitals  Enc Vitals Group     BP 05/26/21 1712 (!) 177/110     Pulse Rate 05/26/21 1712 79     Resp 05/26/21 1712 18     Temp 05/26/21 1712 98 F (36.7 C)     Temp Source 05/26/21 1712 Oral     SpO2 05/26/21 1712 96 %     Weight --      Height --      Head  Circumference --      Peak Flow --      Pain Score 05/26/21 1713 6     Pain Loc --      Pain Edu? --      Excl. in [place]? --    No data found.  Updated Vital Signs BP (!) 177/110 (BP Location: Left Arm)   Pulse 79   Temp 98 F (36.7 C) (Oral)   Resp 18   LMP 12/29/2020   SpO2 96%   Physical Exam Vitals and nursing note reviewed.  Constitutional:      General: She is not in acute distress.    Appearance: Normal appearance. She is not ill-appearing.  HENT:     Head: Normocephalic and atraumatic.     Right Ear: Tympanic membrane normal.     Left Ear: Tympanic membrane normal.     Nose: Congestion present.     Mouth/Throat:     Mouth: Mucous membranes are moist.     Pharynx: No oropharyngeal exudate or posterior oropharyngeal erythema.  Eyes:     Conjunctiva/sclera: Conjunctivae normal.  Cardiovascular:     Rate and Rhythm: Normal rate and regular rhythm.     Heart sounds: Normal heart sounds. No murmur heard. Pulmonary:     Effort: Pulmonary effort is normal. No respiratory distress.     Breath sounds: Normal breath sounds. No wheezing, rhonchi or rales.  Skin:    General: Skin is warm and dry.  Neurological:     Mental Status: She is alert.  Psychiatric:        Mood and Affect: Mood normal.        Thought Content: Thought content normal.     UC Treatments / Results  Labs (all labs ordered are listed, but only abnormal results are displayed) Labs Reviewed  COVID-19, FLU A+B NAA    EKG   Radiology No results found.  Procedures Procedures (including critical care time)  Medications Ordered in UC Medications - No data to display  Initial Impression / Assessment and Plan / UC Course  I have reviewed the triage vital signs and the nursing notes.  Pertinent labs & imaging results that were available during my care of the patient were reviewed by me and considered in my medical decision making (see chart for details).   Suspect likely viral etiology of  symptoms. Will screen for flu and covid. Zofran prescribed for nausea. Encouraged increased fluids and rest.  Recommend follow up with any further concerns.   Final Clinical Impressions(s) / UC Diagnoses   Final diagnoses:  Encounter for screening laboratory testing for COVID-19 virus  Acute upper respiratory infection  Nausea and vomiting, unspecified vomiting type   Discharge Instructions   None    ED Prescriptions     Medication Sig Dispense Auth. Provider   ondansetron (ZOFRAN-ODT) 4 MG disintegrating tablet Take 1 tablet (4 mg total) by mouth every 8 (eight) hours as needed. 20 tablet Francene Finders, PA-C      PDMP not reviewed this encounter.   Francene Finders, PA-C 05/26/21 1811

## 2021-05-26 NOTE — ED Triage Notes (Signed)
Pt c/o vomiting, coughing, headache, and nasal congestion x3 days. States can only keep apple juice down.

## 2021-05-27 LAB — COVID-19, FLU A+B NAA
Influenza A, NAA: NOT DETECTED
Influenza B, NAA: NOT DETECTED
SARS-CoV-2, NAA: NOT DETECTED

## 2021-06-03 ENCOUNTER — Emergency Department (HOSPITAL_COMMUNITY)
Admission: EM | Admit: 2021-06-03 | Discharge: 2021-06-04 | Discharge status: Against Medical Advice | Payer: BC Managed Care – PPO | Source: Home / Self Care

## 2021-06-03 ENCOUNTER — Ambulatory Visit
Admission: EM | Admit: 2021-06-03 | Discharge: 2021-06-03 | Disposition: A | Discharge status: Home / Self Care | Payer: BC Managed Care – PPO

## 2021-06-03 ENCOUNTER — Other Ambulatory Visit: Payer: Self-pay

## 2021-06-03 DIAGNOSIS — I160 Hypertensive urgency: Secondary | ICD-10-CM

## 2021-06-03 NOTE — ED Provider Notes (Signed)
Given extremely elevated BP with headache recommended further evaluation in the ED for stat labs, etc. Patient is agreeable to same. Mother is here with her today and will drive her to the ED immediately after leaving this office.    Francene Finders, PA-C 06/03/21 [phone]

## 2021-06-03 NOTE — ED Triage Notes (Signed)
Pt c/o significant headache which prompted pt to come in. States she was here about a week ago for cold symptoms and during visit it was noted elevated BP. States she was told it could be r/t the cold medicine she was taking. She has been without meds for several days but headache is getting worse which prompted this visit.   Denies changes in vision besides baseline ability.   Onset over a week ago.

## 2021-10-30 IMAGING — MR MR PELVIS WO/W CM
21 of 22 series · 45 of 48 positions shown · IV contrast (gadavist)
Comparison: None.

CLINICAL DATA: Uterine fibroids.

EXAM:
MRI PELVIS WITHOUT AND WITH CONTRAST
TECHNIQUE: Multiplanar multisequence MR imaging of the pelvis was performed
both before and after administration of intravenous contrast.
CONTRAST:  5mL GADAVIST GADOBUTROL 1 MMOL/ML IV SOLN

[Series 1: 3 plane loc · axial · 3.0mm · 1.56mm/px · 1 of 27 slices shown]
[im 1/27]
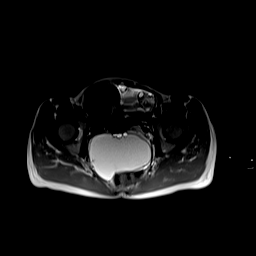

[Series 2: T2 · coronal · 6.0mm · 1.56mm/px · 1 of 30 slices shown (1 of 4)]
[im 1/30]
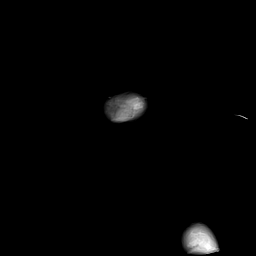

[Series 3: T2 · axial · 5.0mm · 0.51mm/px · 1 of 42 slices shown (2 of 4)]
[im 1/42]
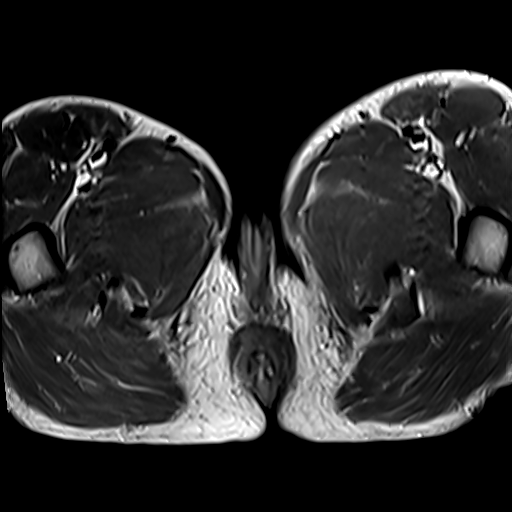

[Series 4: T2 · coronal · 4.0mm · 0.51mm/px · 1 of 41 slices shown (3 of 4)]
[im 1/41]
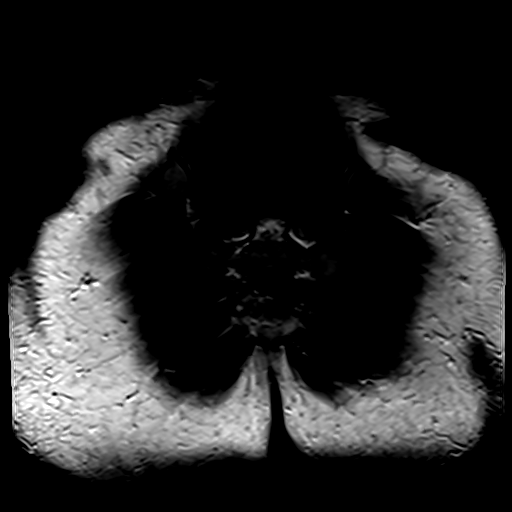

[Series 5: T2 fat-sat · axial · 5.0mm · 0.51mm/px · 1 of 42 slices shown]
[im 1/42]
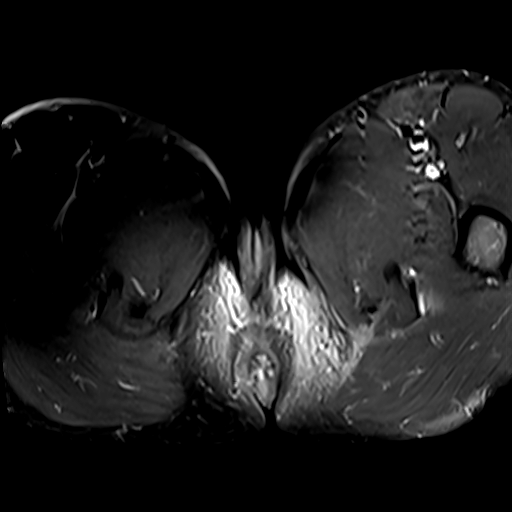

[Series 6: T2 · sagittal · 5.0mm · 0.55mm/px · 1 of 34 slices shown (4 of 4)]
[im 1/34]
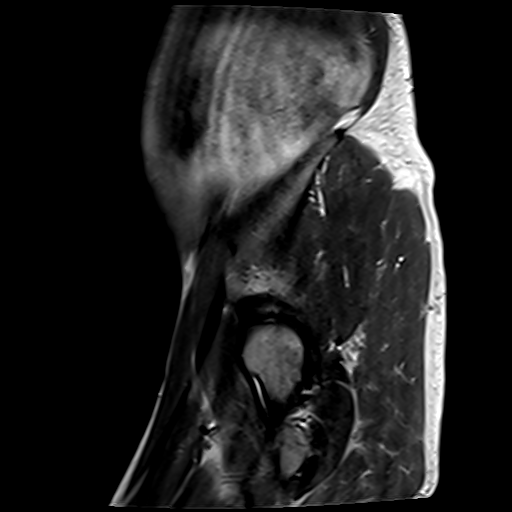

[Series 7: T1 · axial · 4.0mm · 0.84mm/px · z∈[-103,+149]mm · 2 of 64 slices shown (1 of 2)]
[im 1/64]
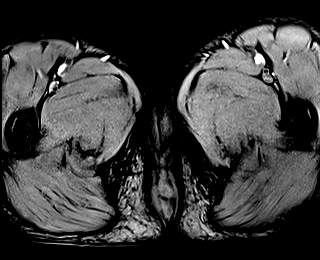
[im 64/64]
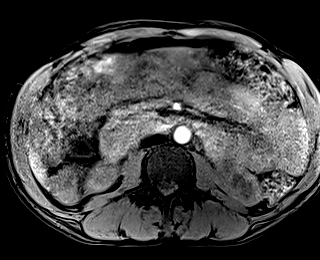

[Series 8: T1 · axial · 4.0mm · 0.84mm/px · z∈[-103,+149]mm · 2 of 64 slices shown (2 of 2)]
[im 1/64]
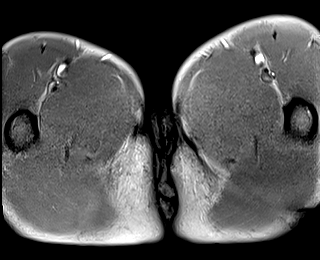
[im 64/64]
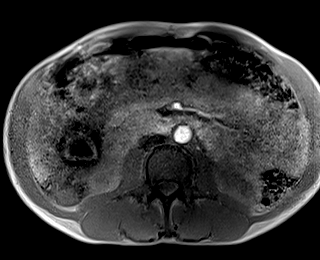

[Series 9: DWI · axial · 5.0mm · 3.11mm/px · z∈[-81,+139]mm · 5 of 131 slices shown (1 of 3)]
[im 1/131]
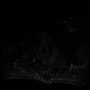
[im 33/131]
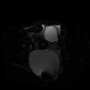
[im 66/131]
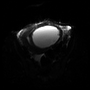
[im 98/131]
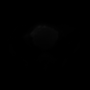
[im 131/131]
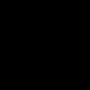

[Series 10: DWI · axial · 5.0mm · 3.11mm/px · z∈[-81,+139]mm · 2 of 45 slices shown (2 of 3)]
[im 1/45]
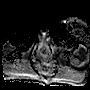
[im 45/45]
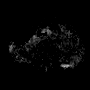

[Series 11: DWI · axial · 5.0mm · 3.11mm/px · z∈[-81,+139]mm · 2 of 45 slices shown (3 of 3)]
[im 1/45]
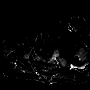
[im 45/45]
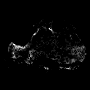

[Series 13: T1 dynamic · axial · 3.0mm · 0.84mm/px · z∈[-96,+141]mm · 3 of 80 slices shown (1 of 7)]
[im 1/80]
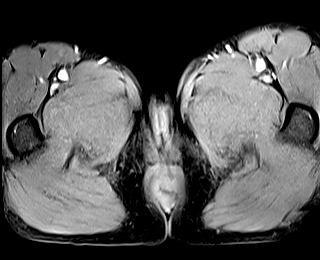
[im 40/80]
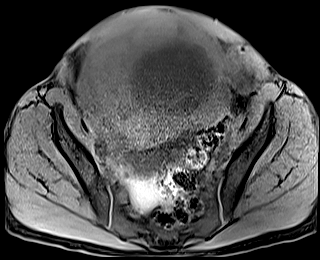
[im 80/80]
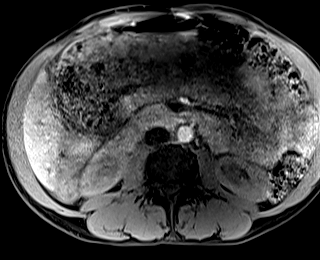

[Series 17: T1 dynamic · axial · 3.0mm · 0.84mm/px · z∈[-96,+141]mm · 3 of 80 slices shown (2 of 7)]
[im 1/80]
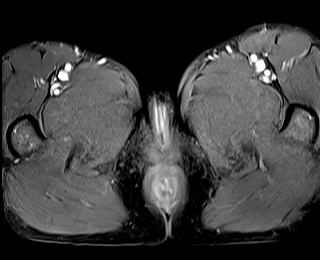
[im 40/80]
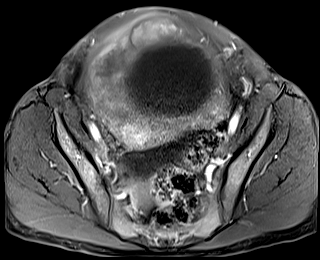
[im 80/80]
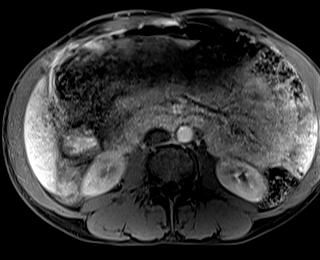

[Series 18: T1 dynamic · axial · 3.0mm · 0.84mm/px · z∈[-96,+141]mm · 3 of 80 slices shown (3 of 7)]
[im 1/80]
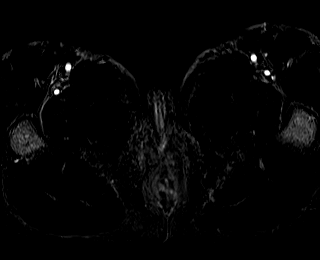
[im 40/80]
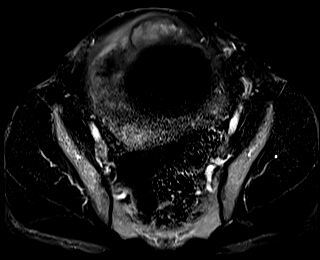
[im 80/80]
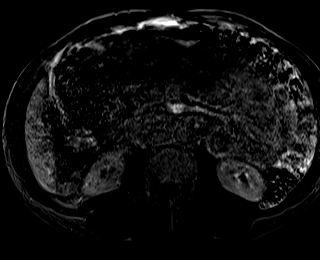

[Series 21: T1 dynamic · axial · 3.0mm · 0.84mm/px · z∈[-96,+141]mm · 3 of 80 slices shown (4 of 7)]
[im 1/80]
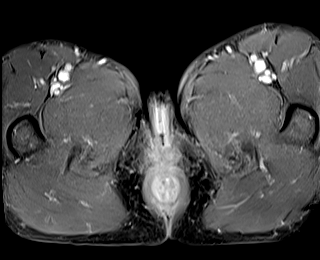
[im 40/80]
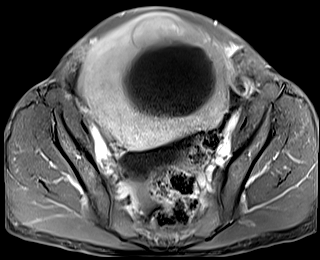
[im 80/80]
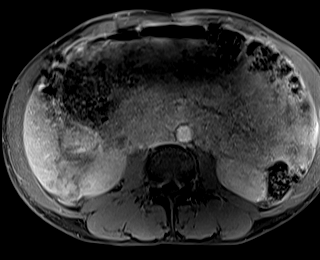

[Series 22: T1 dynamic · axial · 3.0mm · 0.84mm/px · z∈[-96,+141]mm · 3 of 80 slices shown (5 of 7)]
[im 1/80]
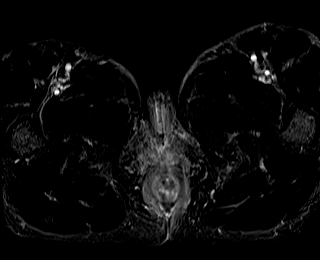
[im 40/80]
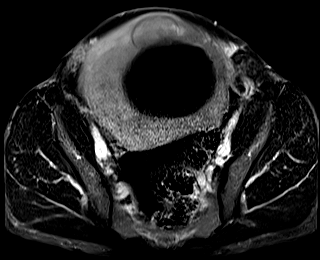
[im 80/80]
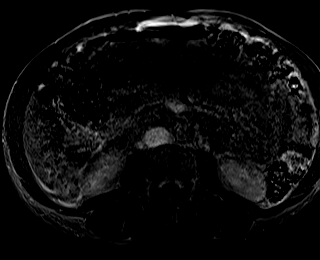

[Series 25: T1 dynamic · axial · 3.0mm · 0.84mm/px · z∈[-96,+141]mm · 3 of 80 slices shown (6 of 7)]
[im 1/80]
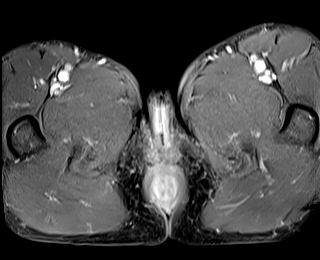
[im 40/80]
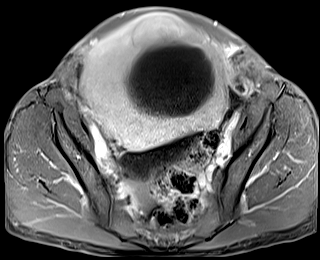
[im 80/80]
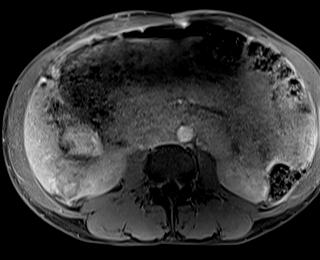

[Series 26: T1 dynamic · axial · 3.0mm · 0.84mm/px · z∈[-96,+141]mm · 3 of 80 slices shown (7 of 7)]
[im 1/80]
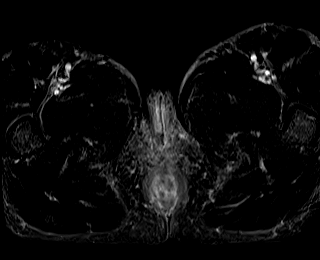
[im 40/80]
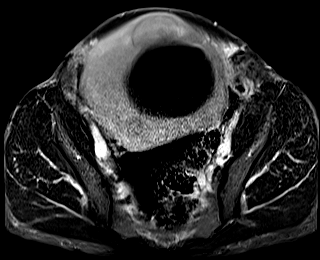
[im 80/80]
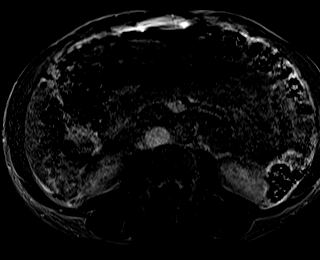

[Series 27: T2 post-contrast · sagittal · 5.0mm · 0.55mm/px · 1 of 34 slices shown]
[im 1/34]
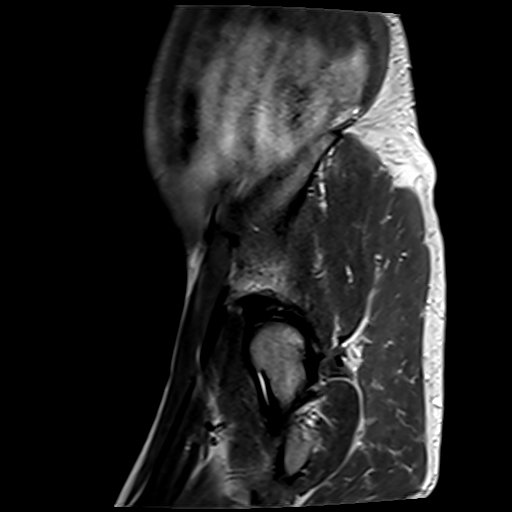

[Series 29: T1 dynamic post-contrast · axial · 3.0mm · 1.06mm/px · z∈[-96,+141]mm · 3 of 80 slices shown (1 of 2)]
[im 1/80]
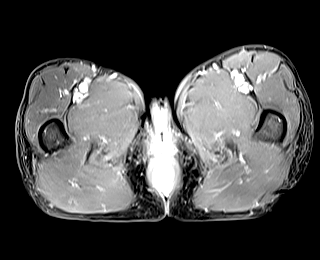
[im 40/80]
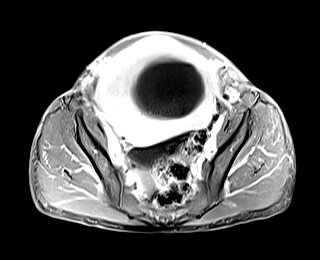
[im 80/80]
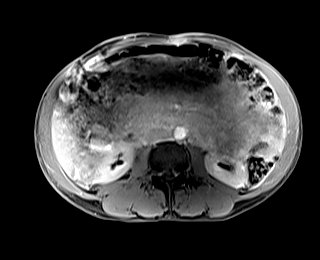

[Series 31: T1 dynamic post-contrast · sagittal · 3.0mm · 0.88mm/px · 1 of 80 slices shown (2 of 2)]
[im 1/80]
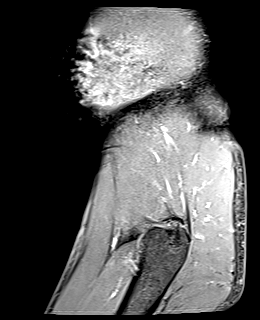

[45 of 48 positions shown; findings below may reference images not displayed]

FINDINGS: Study limited due to mass-effect from bulky uterine fibroid disease
in a cystic mass in the cul-de-sac. These changes served to compress
together pelvic anatomy obscuring tissue planes in some regions.

Urinary Tract: Bladder is decompressed. Coronal imaging suggests no
hydroureteronephrosis.

Bowel: Most of the small bowel has been displaced out of the pelvis.
Sigmoid colon is nondilated.

Vascular/Lymphatic: No pathologically enlarged lymph nodes or other
significant abnormality.

Reproductive:

Uterus: Measures 17.2 X 12.5 x 13.5 cm. Multiple uterine fibroids
evident cervix unremarkable. Estimated volume = 1,518 cc. Multiple
uterine fibroids identified including a dominant largely cystic
lesion in the left uterine body/fundus measuring 10.2 x 10.5 x
cm, displacing the endometrial canal to the right.

Numerous additional intramural and exophytic fibroids are seen in
the lower uterine body and lower uterine segment. Exophytic fibroid
from the left lower uterine segment measures 4.0 x 3.3 x 3.7 cm.
Similar exophytic 4.7 x 3.4 x 3.6 cm fibroid noted anterior right
lower uterine segment. A cluster fibroids in the lower right uterus
protrudes down towards the right groin. Similar sized intramural
fibroid noted lower right uterus.

Intracavitary fibroids: No frankly intracavitary fibroid evident
although 2.6 cm posterior lower uterine fibroid has a submucosal
component.

Pedunculated fibroids: As above, there are multiple exophytic
fibroids in the lower uterus although no frankly pedunculated
fibroid evident.

Fibroid contrast enhancement: After IV contrast administration, the
moderate and small fibroids show diffuse enhancement. The dominant
cystic fibroid shows no central enhancement. On T2 imaging, this
dominant cystic lesion shows peripheral nodularity although no
discernible enhancement within these nodules on postcontrast imaging
(see postcontrast subtraction image 29 of series 22).

Right ovary:  Right ovary cannot be discretely identified.

Left ovary: Left ovary appears to be displaced far anterior in
superiorly, best seen on coronal haste image 9 of series 2,
measuring 5.6 x 3.3 x 2.6 cm. Multiple follicles but no mass lesion.

Other: 9.8 x 9.2 x 7.8 cm multicystic mass is identified in the
cul-de-sac displacing the uterus anteriorly and superiorly and the
rectosigmoid colon posteriorly against the sacrum. This lesion
insinuates cranially up between the posterior uterus in the S1
segment. Multiple nodular components are seen along the anterior
wall of the cyst, at least 2 of which enhance, including the 13 mm
right-sided nodule well demonstrated on axial subtraction image 50
of series 22. Enhancing nodule on the left is seen on axial
subtraction image 48 of series 22. This nodule has a somewhat
papillary appearance and is contained within a sub cyst of lower
signal intensity on T2 imaging (see axial T2 image 25 of series 5).

Musculoskeletal: No focal suspicious marrow enhancement within the
visualized bony anatomy.
IMPRESSION: 1. 10 x 9 x 8 cm multicystic mass lesion identified in the
cul-de-sac. Cystic components have varying size and signal intensity
consistent with variable internal matrix. At least 2 enhancing mural
nodules are identified, one of which has a papillary appearance on
T2 sequences. Imaging features highly concerning for cystic neoplasm
and given the enhancing mural nodules, cystadenocarcinoma becomes a
greater concern. Of note, there is no discernible ascites nor
lymphadenopathy in the pelvis.
2. Marked enlargement of the uterus secondary to fibroid disease.
This enlargement is also mainly due to a single extremely large
cystic fibroid in the left uterine fundus/body. This lesion does
have some mural irregularity and almost mural nodularity in some
regions although these features do not appear to enhance after IV
contrast administration. This is likely a large degenerated fibroid
as there is no substantial enhancing soft tissue component and no
diffuse substantial irregular mural thickening to suggest
leiomyosarcoma.
3. Left ovary appears to be displaced far anterior and superior, but
otherwise unremarkable.

## 2022-02-23 IMAGING — CR DG FOOT COMPLETE 3+V*L*
3 series · 3 of 3 positions shown · non-contrast
Comparison: None.

CLINICAL DATA: Left foot ulcer, question osteomyelitis.  Pain.

EXAM:
LEFT FOOT - COMPLETE 3+ VIEW

[x foot ap left]
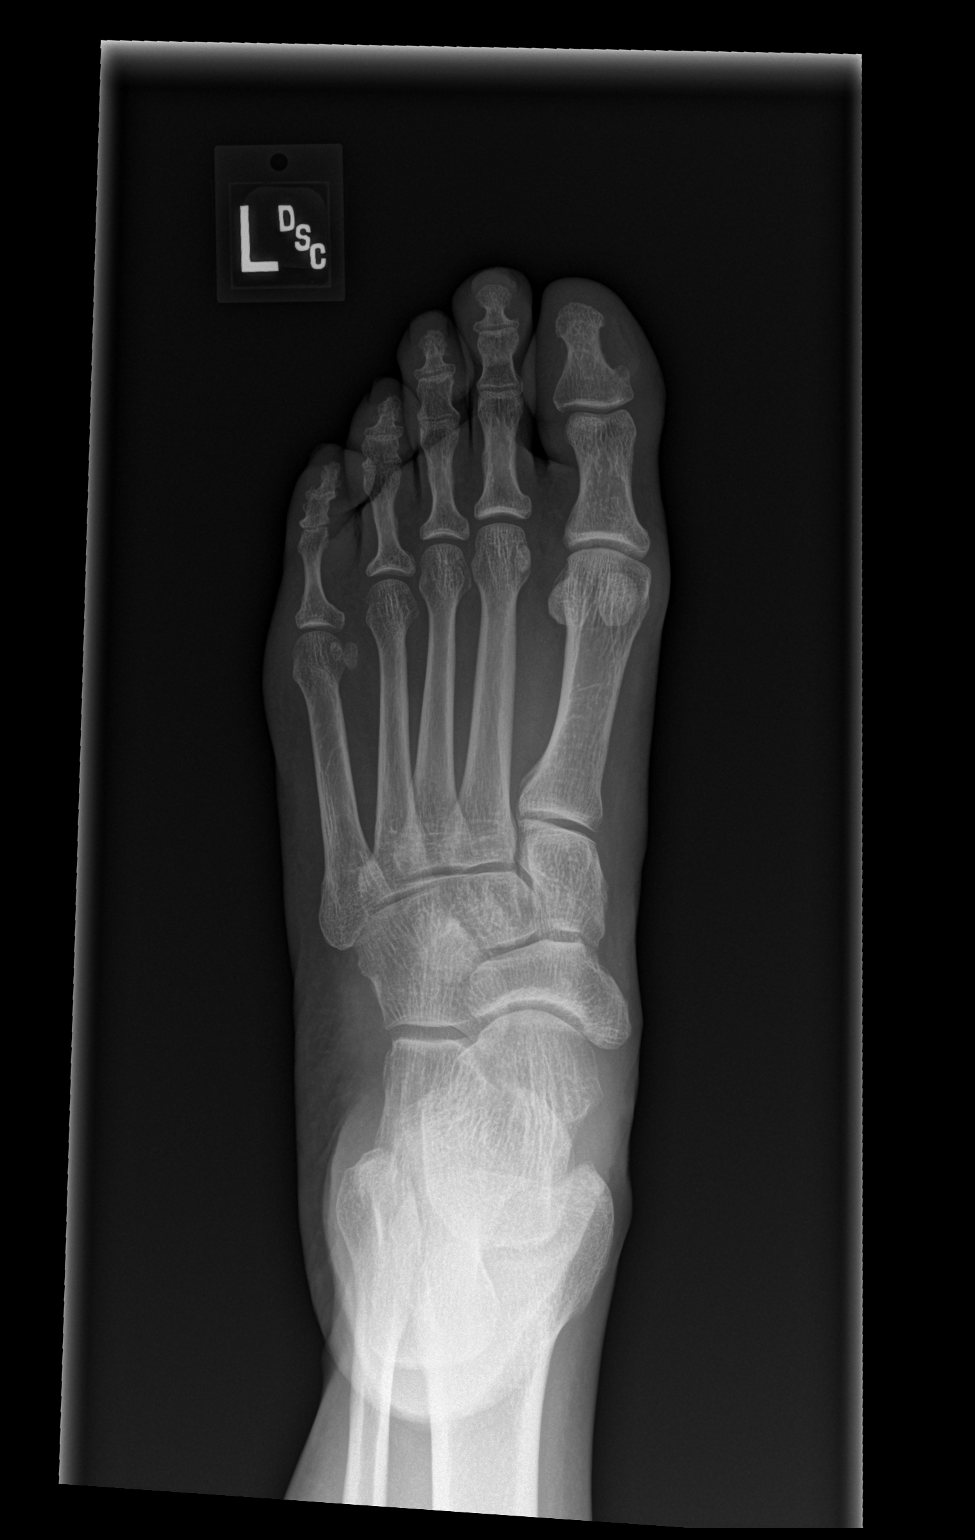

[x foot obl left]
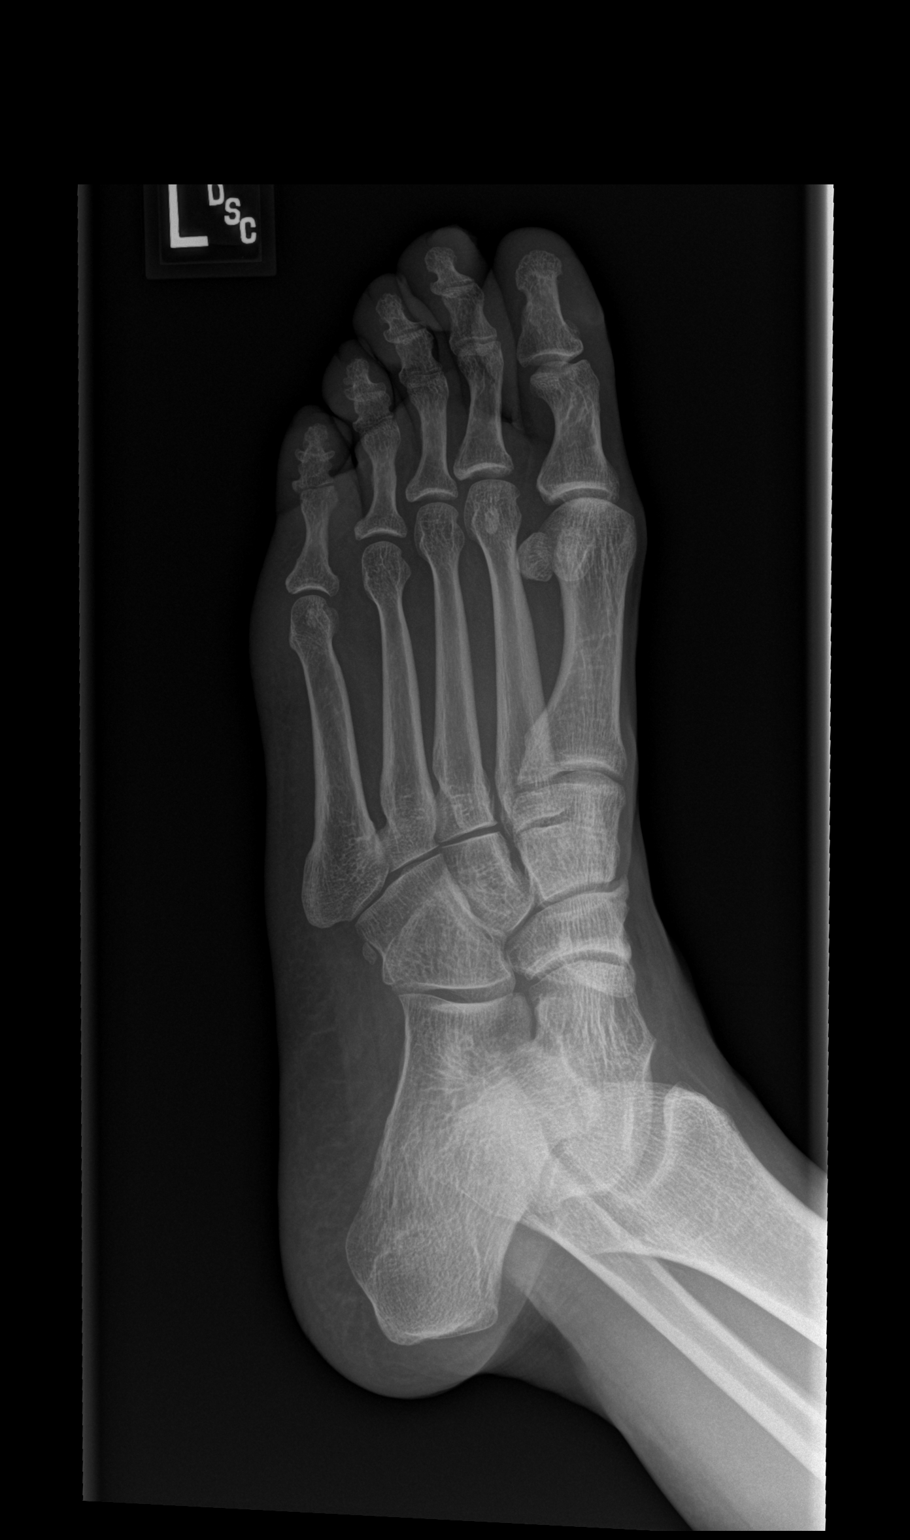

[x foot lat left]
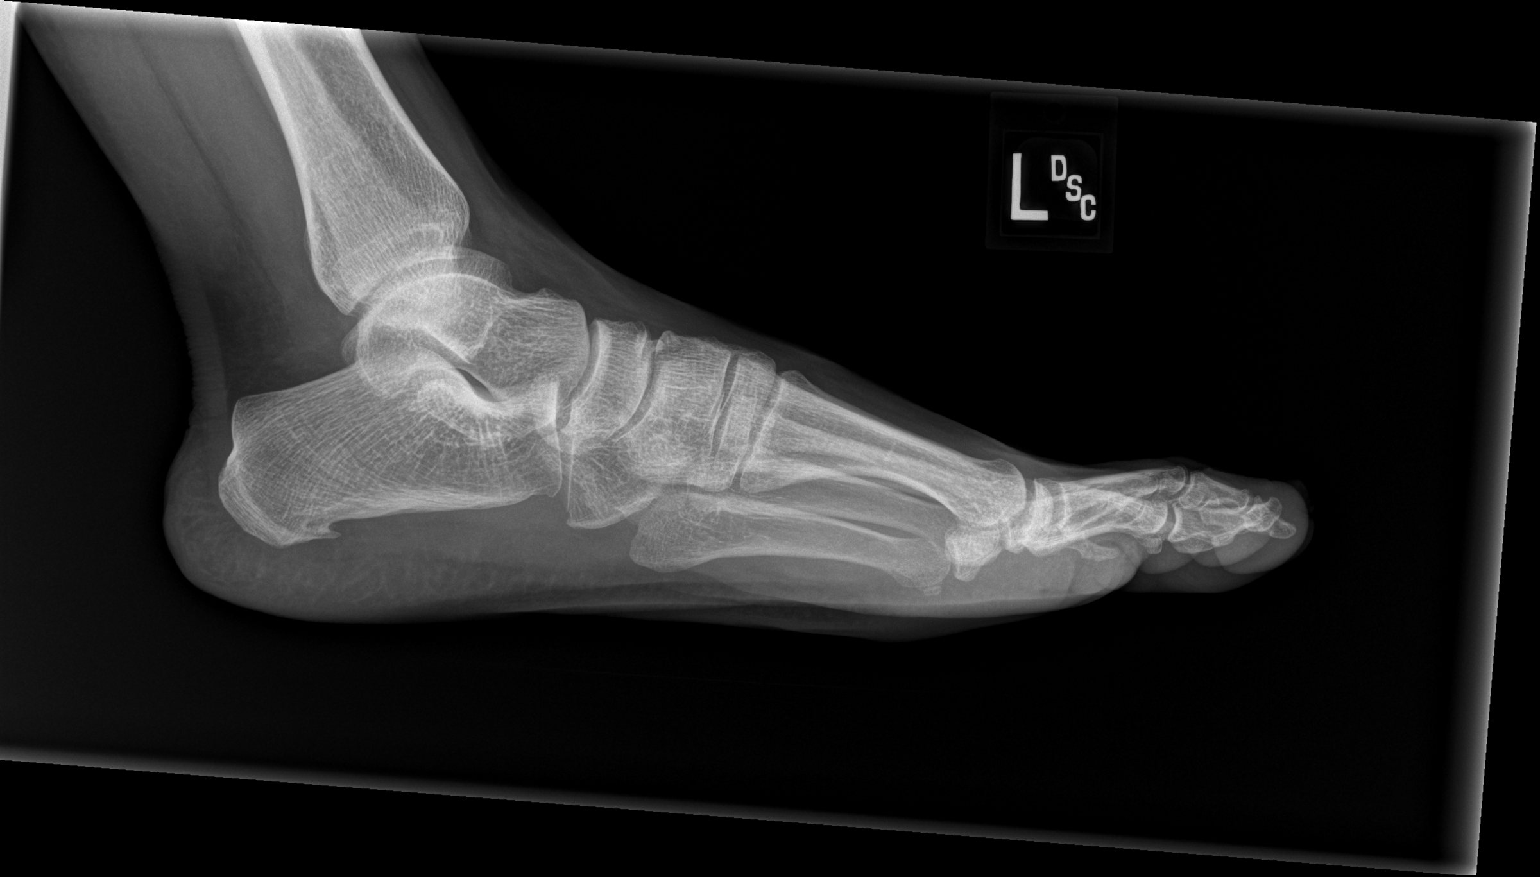

[3 of 3 positions shown; findings below may reference images not displayed]

FINDINGS: There is no evidence of fracture or dislocation. There is no
evidence of arthropathy or other focal bone abnormality. Soft
tissues are unremarkable. No bone destruction to suggest
osteomyelitis.
IMPRESSION: Negative.

## 2022-07-06 ENCOUNTER — Ambulatory Visit
Admission: EM | Admit: 2022-07-06 | Discharge: 2022-07-06 | Disposition: A | Discharge status: Home / Self Care | Payer: BC Managed Care – PPO

## 2023-09-07 ENCOUNTER — Encounter: Payer: Self-pay | Admitting: Emergency Medicine

## 2023-09-07 ENCOUNTER — Ambulatory Visit (INDEPENDENT_AMBULATORY_CARE_PROVIDER_SITE_OTHER)

## 2023-09-07 ENCOUNTER — Ambulatory Visit: Admission: EM | Admit: 2023-09-07 | Discharge: 2023-09-07 | Disposition: A | Discharge status: Home / Self Care

## 2023-09-07 DIAGNOSIS — M25521 Pain in right elbow: Secondary | ICD-10-CM

## 2023-09-07 DIAGNOSIS — D259 Leiomyoma of uterus, unspecified: Secondary | ICD-10-CM

## 2023-09-07 DIAGNOSIS — N83291 Other ovarian cyst, right side: Secondary | ICD-10-CM

## 2023-09-07 DIAGNOSIS — M21622 Bunionette of left foot: Secondary | ICD-10-CM | POA: Insufficient documentation

## 2023-09-07 DIAGNOSIS — M792 Neuralgia and neuritis, unspecified: Secondary | ICD-10-CM | POA: Insufficient documentation

## 2023-09-07 DIAGNOSIS — L84 Corns and callosities: Secondary | ICD-10-CM | POA: Insufficient documentation

## 2023-09-07 DIAGNOSIS — B351 Tinea unguium: Secondary | ICD-10-CM | POA: Insufficient documentation

## 2023-09-07 MED ORDER — KETOROLAC TROMETHAMINE 30 MG/ML IJ SOLN
60.0000 mg | Freq: Once | INTRAMUSCULAR | Status: AC
  Administered 2023-09-07: 60 mg via INTRAMUSCULAR

## 2023-09-07 MED ORDER — IBUPROFEN 800 MG PO TABS: 800.0000 mg | ORAL_TABLET | Freq: Three times a day (TID) | ORAL | 0 refills | Status: AC

## 2023-09-07 MED ORDER — KETOROLAC TROMETHAMINE 30 MG/ML IJ SOLN
30.0000 mg | Freq: Once | INTRAMUSCULAR | Status: DC
Start: 2023-09-07 — End: 2023-09-07

## 2023-09-07 MED ORDER — KETOROLAC TROMETHAMINE 60 MG/2ML IM SOLN: 60.0000 mg | Freq: Once | INTRAMUSCULAR | Status: DC

## 2023-09-07 NOTE — ED Triage Notes (Signed)
 Pt reports R elbow and bicep pain x2 weeks. Pain was initially moderate in intermittent episodes, but has now become constant severe pain that radiated up and back in R shoulder blade. Interfereing with sleep. Usually does not radiate into forearm and hand, but stated that happened once last night. Denies numbness, tingling, swelling, or warm to touch. Denies specific trauma or injury but works for [employer] with lots of lifting and repetitive motions. Hempvana gel applied to area at home with no relief. No relief with tylenol, ibuprofen, or heating pad. Pt adds that R axillary area is also painful this morning, no palpable nodes.

## 2023-09-07 NOTE — ED Provider Notes (Signed)
 [facility]    CSN: [id number] Arrival date & time: 09/07/23  1110      History   Chief Complaint Chief Complaint  Patient presents with   Extremity Pain    R elbow and upper arm    HPI Mackenzie Bray is a 48 y.o. female.   HPI  She is in today with right elbow pain.  She states she is active at work and does a lot of repetitive denies anyone specific injury.  She endorses that this has been going on for approximately 2 weeks.  She reports that the pain varies from moderate to severe.  She has noticed that the pain has started to interfere with her sleep.  She is now experiencing some radiating of pain up into her right upper arm.  She denies any numbness tingling or swelling.  She has had tried some over-the-counter remedies with no relief.  She denies any shortness of breath or chest pains. 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   Neuralgia 09/07/2023   Onychomycosis 09/07/2023   Tailor's bunion of left foot 09/07/2023   Uterine fibroid 12/13/2020   Iron deficiency anemia due to chronic blood loss 12/13/2020   Pelvic mass in female 12/13/2020   Complex cyst of right ovary 12/13/2020   Intramural, submucous, and subserous leiomyoma of uterus 12/13/2020    Past Surgical History:  Procedure Laterality Date   ROBOTIC ASSISTED TOTAL HYSTERECTOMY WITH BILATERAL SALPINGO OOPHERECTOMY Bilateral 02/22/2021   Procedure: XI ROBOTIC ASSISTED TOTALHYSTERECTOMY GREATER THAN TWO HUNDRED AND FIFTY GRAMS WITH BILATERAL SALPINGECTOMY RIGHT OOPHORECTOMY, MINI LAPAROTOMY;  Surgeon: Adolphus Birchwood, MD;  Location: [facility];  Service: Gynecology;  Laterality: Bilateral;   WISDOM TOOTH EXTRACTION      OB History   No obstetric history on file.      Home Medications    Prior to Admission medications   Medication Sig Start Date End Date Taking? Authorizing Provider  ferrous sulfate  325 (65 FE) MG tablet Take 1 tablet (325 mg total) by mouth daily with breakfast. 12/08/20  Yes Placido Sou, PA-C  ciclopirox (PENLAC) 8 % solution 1 application Externally Once a day for 90 days Patient not taking: Reported on 09/07/2023    [provider]  docusate sodium (COLACE) 100 MG capsule Take 100 mg by mouth 2 (two) times daily. Patient not taking: Reported on 03/15/2021    [provider]  folic acid (FOLVITE) 1 MG tablet Take 1 mg by mouth daily. Patient not taking: Reported on 09/07/2023 12/14/20   [provider]  ibuprofen (ADVIL) 800 MG tablet Take 1 tablet (800 mg total) by mouth 3 (three) times daily for 10 days. 09/07/23 09/17/23  Barbette Merino, NP  ondansetron (ZOFRAN-ODT) 4 MG disintegrating tablet Take 1 tablet (4 mg total) by mouth every 8 (eight) hours as needed. Patient not taking: Reported on 09/07/2023 05/26/21   Tomi Bamberger, PA-C  senna-docusate (SENOKOT-S) 8.6-50 MG tablet Take 2 tablets by mouth at bedtime. For AFTER surgery, do not take if having diarrhea Patient not taking: Reported on 09/07/2023 02/23/21   Doylene Bode, NP    Family History Family History  Problem Relation Age of Onset   Breast cancer Mother    Diabetes Father     Social History Social History   Tobacco Use   Smoking status: Former    Current  packs/day: 0.25    Average packs/day: 0.3 packs/day for 20.0 years (5.0 ttl pk-yrs)    Types: Cigarettes   Smokeless tobacco: Never  Vaping Use   Vaping status: Never Used  Substance Use Topics   Alcohol use: Not Currently    Alcohol/week: 1.0 standard drink of alcohol    Types: 1 Standard drinks or equivalent per week   Drug use: Never     Allergies   Bee pollen   Review of Systems Review of Systems   Physical Exam Triage Vital Signs ED Triage Vitals [09/07/23 1149]  Encounter Vitals Group     BP 117/81     Systolic BP Percentile      Diastolic BP Percentile      Pulse Rate 83     Resp 18      Temp 97.8 F (36.6 C)     Temp Source Oral     SpO2 96 %     Weight      Height      Head Circumference      Peak Flow      Pain Score 7     Pain Loc      Pain Education      Exclude from Growth Chart    No data found.  Updated Vital Signs BP 117/81 (BP Location: Left Arm)   Pulse 83   Temp 97.8 F (36.6 C) (Oral)   Resp 18   LMP 12/29/2020   SpO2 96%   Visual Acuity Right Eye Distance:   Left Eye Distance:   Bilateral Distance:    Right Eye Near:   Left Eye Near:    Bilateral Near:     Physical Exam Constitutional:      Appearance: She is normal weight.  HENT:     Head: Normocephalic and atraumatic.  Cardiovascular:     Rate and Rhythm: Normal rate.  Pulmonary:     Effort: Pulmonary effort is normal.  Musculoskeletal:     Right elbow: No deformity. Decreased range of motion. Tenderness present in lateral epicondyle. No radial head, medial epicondyle or olecranon process tenderness.     Left elbow: Normal.  Neurological:     Mental Status: She is alert.      UC Treatments / Results  Labs (all labs ordered are listed, but only abnormal results are displayed) Labs Reviewed - No data to display  EKG   Radiology DG Elbow Complete Right Result Date: 09/07/2023 CLINICAL DATA:  Pain for 2 weeks.  No history of trauma reported EXAM: RIGHT ELBOW - COMPLETE 4 VIEW COMPARISON:  None Available. FINDINGS: There is no evidence of fracture, dislocation, or joint effusion. There is no evidence of arthropathy or other focal bone abnormality. Soft tissues are unremarkable. IMPRESSION: No acute osseous abnormality. Electronically Signed   By: Karen Kays M.D.   On: 09/07/2023 13:47    Procedures Procedures (including critical care time)  Medications Ordered in UC Medications  ketorolac (TORADOL) 30 MG/ML injection 60 mg (60 mg Intramuscular Given 09/07/23 1245)    Initial Impression / Assessment and Plan / UC Course  I have reviewed the triage vital signs and  the nursing notes.  Pertinent labs & imaging results that were available during my care of the patient were reviewed by me and considered in my medical decision making (see chart for details).     Right elbow pain Final Clinical Impressions(s) / UC Diagnoses   Final diagnoses:  Right elbow pain  Discharge Instructions      You have been diagnosed with right elbow pain.  You have been given a Toradol 60 mg injection in the clinic.  The recommendation is for you to take the ibuprofen 800 mg up to 3 times daily starting tomorrow for inflammation that can cause pain.  Your x-ray is pending radiology over read as discussed the results will display in your MyChart.  If your symptoms persist or get worse you are encouraged to follow-up with [facility] who provides acute care for all your orthopedic needs.  You can also always follow-up in urgent care or your nearest emergency.    ED Prescriptions     Medication Sig Dispense Auth. Provider   ibuprofen (ADVIL) 800 MG tablet Take 1 tablet (800 mg total) by mouth 3 (three) times daily for 10 days. 30 tablet Barbette Merino, NP      PDMP not reviewed this encounter.   Thad Ranger [place], [place] 09/07/23 [phone]

## 2023-09-07 NOTE — Discharge Instructions (Signed)
 You have been diagnosed with right elbow pain.  You have been given a Toradol 60 mg injection in the clinic.  The recommendation is for you to take the ibuprofen 800 mg up to 3 times daily starting tomorrow for inflammation that can cause pain.  Your x-ray is pending radiology over read as discussed the results will display in your MyChart.  If your symptoms persist or get worse you are encouraged to follow-up with [facility] who provides acute care for all your orthopedic needs.  You can also always follow-up in urgent care or your nearest emergency.

## 2024-06-15 ENCOUNTER — Ambulatory Visit
Admission: EM | Admit: 2024-06-15 | Discharge: 2024-06-15 | Disposition: A | Discharge status: Home / Self Care | Attending: Emergency Medicine | Admitting: Emergency Medicine

## 2024-06-15 DIAGNOSIS — J029 Acute pharyngitis, unspecified: Secondary | ICD-10-CM | POA: Diagnosis present

## 2024-06-15 DIAGNOSIS — J0100 Acute maxillary sinusitis, unspecified: Secondary | ICD-10-CM | POA: Diagnosis present

## 2024-06-15 DIAGNOSIS — J039 Acute tonsillitis, unspecified: Secondary | ICD-10-CM | POA: Insufficient documentation

## 2024-06-15 LAB — POCT RAPID STREP A (OFFICE): Rapid Strep A Screen: NEGATIVE

## 2024-06-15 MED ORDER — ALUM & MAG HYDROXIDE-SIMETH 200-200-20 MG/5ML PO SUSP
10.0000 mL | Freq: Once | ORAL | Status: AC
  Administered 2024-06-15: 10 mL via ORAL

## 2024-06-15 MED ORDER — IBUPROFEN 600 MG PO TABS: 600.0000 mg | ORAL_TABLET | Freq: Four times a day (QID) | ORAL | 0 refills | Status: AC | PRN

## 2024-06-15 MED ORDER — DEXAMETHASONE SOD PHOSPHATE PF 10 MG/ML IJ SOLN
10.0000 mg | Freq: Once | INTRAMUSCULAR | Status: AC
  Administered 2024-06-15: 10 mg via INTRAMUSCULAR

## 2024-06-15 MED ORDER — ALUMINUM & MAGNESIUM HYDROXIDE 200-200 MG/5ML PO SUSP: 10.0000 mL | Freq: Once | ORAL | Status: DC

## 2024-06-15 MED ORDER — FLUTICASONE PROPIONATE 50 MCG/ACT NA SUSP: 2.0000 | Freq: Every day | NASAL | 0 refills | Status: AC

## 2024-06-15 MED ORDER — DIPHENHYDRAMINE HCL 12.5 MG/5ML PO ELIX: 12.5000 mg | ORAL_SOLUTION | Freq: Once | ORAL | Status: DC

## 2024-06-15 MED ORDER — PROMETHAZINE-DM 6.25-15 MG/5ML PO SYRP: 5.0000 mL | ORAL_SOLUTION | Freq: Four times a day (QID) | ORAL | 0 refills | Status: AC | PRN

## 2024-06-15 MED ORDER — DIPHENHYDRAMINE HCL 12.5 MG/5ML PO ELIX
25.0000 mg | ORAL_SOLUTION | Freq: Once | ORAL | Status: AC
  Administered 2024-06-15: 25 mg via ORAL

## 2024-06-15 NOTE — ED Triage Notes (Signed)
 Pt c/o headache, sore throat, cough, x2days  Pt states that it feels like a balloon is being inflated inside her head  Pt has taken OTC medication for symptoms  Pt declines a covid and flu test.

## 2024-06-15 NOTE — ED Provider Notes (Signed)
 " HPI  SUBJECTIVE:  Patient reports sore throat starting ***. Sx worse with swallowing.  Sx better with ***. Has been taking *** w/ o relief.  + Fever tmax *** No cervical lymphadenopathy  no neck stiffness  No Cough, wheezing No nasal congestion, rhinorrhea, postnasal drip No Myalgias No Headache No Rash  No loss of taste or smell No shortness of breath  No nausea, vomiting No diarrhea No abdominal pain     No Recent Strep, mono, flu, COVID exposure No reflux sxs No Allergy sxs  No Breathing difficulty, voice changes, sensation of throat swelling shut No Drooling No Trismus No abx in past month. All immunizations UTD.  No antipyretic in past 6 hrs    Past Medical History:  Diagnosis Date   Anemia     Past Surgical History:  Procedure Laterality Date   ROBOTIC ASSISTED TOTAL HYSTERECTOMY WITH BILATERAL SALPINGO OOPHERECTOMY Bilateral 02/22/2021   Procedure: XI ROBOTIC ASSISTED TOTALHYSTERECTOMY GREATER THAN TWO HUNDRED AND FIFTY GRAMS WITH BILATERAL SALPINGECTOMY RIGHT OOPHORECTOMY, MINI LAPAROTOMY;  Surgeon: Eloy Herring, MD;  Location: [facility];  Service: Gynecology;  Laterality: Bilateral;   WISDOM TOOTH EXTRACTION      Family History  Problem Relation Age of Onset   Breast cancer Mother    Diabetes Father     Social History[1]  Current Medications[2]  Allergies[3]   ROS  As noted in HPI.   Physical Exam  BP 135/80 (BP Location: Left Arm)   Pulse 69   Temp 97.7 F (36.5 C) (Oral)   Resp 16   Wt 67.9 kg   LMP 12/29/2020   SpO2 94%   BMI 24.55 kg/m  *** Constitutional: Well developed, well nourished, no acute distress Eyes:  EOMI, conjunctiva normal bilaterally HENT: Normocephalic, atraumatic,mucus membranes moist. +  nasal congestion.  Erythematous, swollen turbinates.  Positive bilateral maxillary sinus tenderness.  Intensely erythematous oropharynx + enlarged, almost touching tonsils - exudates. Uvula midline.  No uvular swelling.  No neck  stiffness.  Soft, but not muffled voice.  No drooling, trismus. Respiratory: Normal inspiratory effort, lungs clear bilaterally Cardiovascular: Normal rate, no murmurs, rubs, gallops GI: nondistended, nontender. No appreciable splenomegaly skin: No rash, skin intact Lymph:+ Anterior cervical LN.  No posterior cervical lymphadenopathy. Musculoskeletal: no deformities Neurologic: Alert & oriented x 3, no focal neuro deficits Psychiatric: Speech and behavior appropriate. At baseline mental status per caregiver.   ED Course   Medications  dexamethasone  (DECADRON ) injection 10 mg (has no administration in time range)  diphenhydrAMINE  (BENADRYL ) 12.5 MG/5ML elixir 12.5 mg (has no administration in time range)  aluminum -magnesium  hydroxide 200-200 MG/5ML suspension 10 mL (has no administration in time range)    Orders Placed This Encounter  Procedures   Culture, group A strep    Standing Status:   Standing    Number of Occurrences:   1   POCT rapid strep A    Standing Status:   Standing    Number of Occurrences:   1    Results for orders placed or performed during the hospital encounter of 06/15/24 (from the past 24 hours)  POCT rapid strep A     Status: Normal   Collection Time: 06/15/24 10:16 AM  Result Value Ref Range   Rapid Strep A Screen Negative Negative   No results found.  ED Clinical Impression  1. Acute pharyngitis, unspecified etiology   2. Acute tonsillitis, unspecified etiology   3. Acute non-recurrent maxillary sinusitis      ED Assessment/Plan  {  The patient has been seen in Urgent Care in the last 3 years. :1}  Patient with intensely, very swollen tonsils, no evidence of impending airway obstruction.  Giving dexamethasone  10 mg IM and Benadryl /Maalox mixture here.  Rapid strep negative.  Will send off throat culture to confirm absence of strep throat.  If positive, we will call in the appropriate antibiotics.  In the meantime, patient home with ibuprofen ,  Tylenol , Benadryl /Maalox mixture, Mucinex D, saline nasal occasion, Flonase , Promethazine  DM.  He also has bilateral maxillary sinusitis, but no indication for antibiotics.  Patient to followup with PCP of choice when necessary, will refer to local primary care resources.  Discussed labs,  MDM, plan and followup with patient. Discussed sn/sx that should prompt return to the ED. patient agrees with plan.   Meds ordered this encounter  Medications   dexamethasone  (DECADRON ) injection 10 mg   diphenhydrAMINE  (BENADRYL ) 12.5 MG/5ML elixir 12.5 mg   aluminum -magnesium  hydroxide 200-200 MG/5ML suspension 10 mL   fluticasone  (FLONASE ) 50 MCG/ACT nasal spray    Sig: Place 2 sprays into both nostrils daily.    Dispense:  16 g    Refill:  0   ibuprofen  (ADVIL ) 600 MG tablet    Sig: Take 1 tablet (600 mg total) by mouth every 6 (six) hours as needed.    Dispense:  30 tablet    Refill:  0   promethazine -dextromethorphan (PROMETHAZINE -DM) 6.25-15 MG/5ML syrup    Sig: Take 5 mLs by mouth 4 (four) times daily as needed for cough.    Dispense:  118 mL    Refill:  0     *This clinic note was created using Scientist, clinical (histocompatibility and immunogenetics). Therefore, there may be occasional mistakes despite careful proofreading.      [1]  Social History Tobacco Use   Smoking status: Former    Current packs/day: 0.25    Average packs/day: 0.3 packs/day for 20.0 years (5.0 ttl pk-yrs)    Types: Cigarettes   Smokeless tobacco: Never  Vaping Use   Vaping status: Never Used  Substance Use Topics   Alcohol use: Not Currently    Alcohol/week: 1.0 standard drink of alcohol    Types: 1 Standard drinks or equivalent per week   Drug use: Never  [2]  Current Facility-Administered Medications:    aluminum -magnesium  hydroxide 200-200 MG/5ML suspension 10 mL, 10 mL, Oral, Once, Van Knee, MD   dexamethasone  (DECADRON ) injection 10 mg, 10 mg, Intramuscular, Once, Kalise Fickett, MD   diphenhydrAMINE  (BENADRYL ) 12.5  MG/5ML elixir 12.5 mg, 12.5 mg, Oral, Once, Van Knee, MD  Current Outpatient Medications:    fluticasone  (FLONASE ) 50 MCG/ACT nasal spray, Place 2 sprays into both nostrils daily., Disp: 16 g, Rfl: 0   ibuprofen  (ADVIL ) 600 MG tablet, Take 1 tablet (600 mg total) by mouth every 6 (six) hours as needed., Disp: 30 tablet, Rfl: 0   promethazine -dextromethorphan (PROMETHAZINE -DM) 6.25-15 MG/5ML syrup, Take 5 mLs by mouth 4 (four) times daily as needed for cough., Disp: 118 mL, Rfl: 0 [3]  Allergies Allergen Reactions   Bee Pollen     Congestion (just pollen)    "

## 2024-06-15 NOTE — Discharge Instructions (Signed)
 Your rapid strep was negative today.  We have sent it off for culture to confirm absence of strep throat.  If it is positive, we will contact you and call in the appropriate antibiotics.  1 gram of Tylenol  and 600 mg ibuprofen  together 3-4 times a day as needed for pain.  Make sure you drink plenty of extra fluids.  Some people find salt water  gargles and  Traditional Medicinal's Throat Coat tea helpful. Take 5 mL of liquid Benadryl  and 5 mL of Maalox/Mylanta. Mix it together, and then hold it in your mouth for as long as you can and then swallow. You may do this 4 times a day.  Honey and lemon dissolved in hot water  can also be soothing.  Start Mucinex-D to keep the mucous thin and to decongest you.   You may take 600 mg of motrin  with 1000 mg of tylenol  up to 3-4 times a day as needed for pain. This is an effective combination for pain.  Most sinus infections are viral and do not need antibiotics unless you have a high fever, have had this for 10 days, or you get better and then get sick again. Use a NeilMed sinus rinse with distilled water  as often as you want to to reduce nasal congestion. Follow the directions on the box.  Visiting DM for cough.  Below is a list of primary care practices who are taking new patients for you to follow-up with.  [facility] -multiple locations.  See website at [email or web address]  [facility] internal medicine clinic Ground Floor - [facility], [address] [place], [place], [place] [address] [phone]  [facility] at [facility] [address] [place], [place] [address] [phone]  [facility]- will see patients with no insurance [address] [place], [place] [address] [place], [place] [address] [phone]  [facility] [phone] [address] [place] [address]  [facility]: [address]  [place]  [address]  [phone]  [facility]: [address] [place]  [address]  [phone]  [facility] : [address] [place]  [address] [phone]  [facility]: [address] [place]  [address] [phone]  [facility]: [address] [place]  [address] [phone]  Dr. Timm Cliche [address] [place]  [facility]- will see patients with no insurance. [address], [place], [place] [address] [phone]  Go to www.goodrx.com  or www.costplusdrugs.com to look up your medications. This will give you a list of where you can find your prescriptions at the most affordable prices. Or ask the pharmacist what the cash price is, or if they have any other discount programs available to help make your medication more affordable. This can be less expensive than what you would pay with insurance.

## 2024-06-17 LAB — CULTURE, GROUP A STREP (THRC)

## 2024-06-18 ENCOUNTER — Ambulatory Visit (HOSPITAL_COMMUNITY): Payer: Self-pay
